# Patient Record
Sex: Female | Born: 1980 | Race: White | Hispanic: No | Marital: Single | Smoking: Current every day smoker
Health system: Southern US, Community
[De-identification: ages and names within clinical notes are randomized; demographics above are authoritative.]

## PROBLEM LIST (undated history)

## (undated) DIAGNOSIS — N289 Disorder of kidney and ureter, unspecified: Secondary | ICD-10-CM

## (undated) DIAGNOSIS — R87629 Unspecified abnormal cytological findings in specimens from vagina: Secondary | ICD-10-CM

## (undated) HISTORY — PX: OTHER SURGICAL HISTORY: SHX169

## (undated) HISTORY — DX: Unspecified abnormal cytological findings in specimens from vagina: R87.629

---

## 2017-03-16 ENCOUNTER — Encounter: Payer: Self-pay | Admitting: Emergency Medicine

## 2017-03-16 ENCOUNTER — Emergency Department: Payer: 59

## 2017-03-16 ENCOUNTER — Emergency Department
Admission: EM | Admit: 2017-03-16 | Discharge: 2017-03-16 | Disposition: A | Discharge status: Home / Self Care | Payer: 59 | Attending: Emergency Medicine | Admitting: Emergency Medicine

## 2017-03-16 ENCOUNTER — Other Ambulatory Visit: Payer: Self-pay

## 2017-03-16 DIAGNOSIS — R05 Cough: Secondary | ICD-10-CM

## 2017-03-16 DIAGNOSIS — J0100 Acute maxillary sinusitis, unspecified: Secondary | ICD-10-CM | POA: Insufficient documentation

## 2017-03-16 DIAGNOSIS — R059 Cough, unspecified: Secondary | ICD-10-CM

## 2017-03-16 DIAGNOSIS — Z87891 Personal history of nicotine dependence: Secondary | ICD-10-CM | POA: Diagnosis not present

## 2017-03-16 DIAGNOSIS — R0600 Dyspnea, unspecified: Secondary | ICD-10-CM | POA: Diagnosis present

## 2017-03-16 HISTORY — DX: Disorder of kidney and ureter, unspecified: N28.9

## 2017-03-16 MED ORDER — NAPROXEN 500 MG PO TABS: 500.0000 mg | ORAL_TABLET | Freq: Two times a day (BID) | ORAL | Status: DC

## 2017-03-16 MED ORDER — FEXOFENADINE-PSEUDOEPHED ER 60-120 MG PO TB12: 1.0000 | ORAL_TABLET | Freq: Two times a day (BID) | ORAL | 0 refills | Status: DC

## 2017-03-16 MED ORDER — KETOROLAC TROMETHAMINE 60 MG/2ML IM SOLN
60.0000 mg | Freq: Once | INTRAMUSCULAR | Status: AC
  Administered 2017-03-16: 60 mg via INTRAMUSCULAR
  Filled 2017-03-16: qty 2

## 2017-03-16 MED ORDER — BENZONATATE 100 MG PO CAPS: 200.0000 mg | ORAL_CAPSULE | Freq: Three times a day (TID) | ORAL | 0 refills | Status: AC | PRN

## 2017-03-16 MED ORDER — AMOXICILLIN 500 MG PO CAPS
500.0000 mg | ORAL_CAPSULE | Freq: Three times a day (TID) | ORAL | 0 refills | Status: DC
Start: 2017-03-16 — End: 2019-05-24

## 2017-03-16 MED ORDER — ONDANSETRON HCL 8 MG PO TABS: 8.0000 mg | ORAL_TABLET | Freq: Three times a day (TID) | ORAL | 0 refills | Status: DC | PRN

## 2017-03-16 MED ORDER — ONDANSETRON 8 MG PO TBDP
8.0000 mg | ORAL_TABLET | Freq: Once | ORAL | Status: AC
  Administered 2017-03-16: 8 mg via ORAL
  Filled 2017-03-16: qty 1

## 2017-03-16 MED ORDER — HYDROCOD POLST-CPM POLST ER 10-8 MG/5ML PO SUER
5.0000 mL | Freq: Once | ORAL | Status: AC
  Administered 2017-03-16: 5 mL via ORAL
  Filled 2017-03-16: qty 5

## 2017-03-16 NOTE — ED Triage Notes (Signed)
Arrives with c/o chest pain and SOB x 1 month.  States had been coughing for about 3 weeks, but symptoms had improved over the last couple of days.  Today arrives with sinus congestion, body aches.  States has had decreased PO intake over past 2 days.

## 2017-03-16 NOTE — ED Provider Notes (Signed)
This patient was not seen by me directly however EKG was presented to me, shows sinus tachycardia rate 105 no acute ST elevation or depression normal axis QTC 454   Jeanmarie PlantMcShane, James A, MD 03/16/17 1135

## 2017-03-16 NOTE — ED Provider Notes (Signed)
[facility] Emergency Department Provider Note   ____________________________________________   First MD Initiated Contact with Patient 03/16/17 0830     (approximate)  I have reviewed the triage vital signs and the nursing notes.   HISTORY  Chief Complaint URI and sinus congestion    HPI Karen Crane is a 36 y.o. female patient complaining of chest pain and dyspnea for 1 month. Patient also states productive cough for 3 weeks. Patient stated called wax and wane. Patient presents today that also was sinus congestion and body aches. No palliative measures for this complaint.Patient rates her pain discomfort as a 6/10. Patient described a pain as "achy".   Past Medical History:  Diagnosis Date  . Renal disorder     There are no active problems to display for this patient.   History reviewed. No pertinent surgical history.  Prior to Admission medications   Medication Sig Start Date End Date Taking? Authorizing Provider  amoxicillin (AMOXIL) 500 MG capsule Take 1 capsule (500 mg total) 3 (three) times daily by mouth. 03/16/17   Joni ReiningSmith, Ronald K, PA-C  benzonatate (TESSALON PERLES) 100 MG capsule Take 2 capsules (200 mg total) 3 (three) times daily as needed by mouth for cough. 03/16/17 03/16/18  Joni ReiningSmith, Ronald K, PA-C  fexofenadine-pseudoephedrine (ALLEGRA-D) 60-120 MG 12 hr tablet Take 1 tablet 2 (two) times daily by mouth. 03/16/17   Joni ReiningSmith, Ronald K, PA-C  naproxen (NAPROSYN) 500 MG tablet Take 1 tablet (500 mg total) 2 (two) times daily with a meal by mouth. 03/16/17   Joni ReiningSmith, Ronald K, PA-C  ondansetron (ZOFRAN) 8 MG tablet Take 1 tablet (8 mg total) every 8 (eight) hours as needed by mouth for nausea or vomiting. 03/16/17   Joni ReiningSmith, Ronald K, PA-C    Allergies Patient has no known allergies.  No family history on file.  Social History Social History   Tobacco Use  . Smoking status: Former Games developermoker  . Smokeless tobacco: Never Used  Substance  Use Topics  . Alcohol use: Yes  . Drug use: Yes    Types: Cocaine, Marijuana    Review of Systems  Constitutional: Fever chills Eyes: No visual changes. ENT: Nasal congestion runny nose  Cardiovascular: Denies chest pain. Respiratory: Dyspnea and productive cough Gastrointestinal: No abdominal pain.  No nausea, no vomiting.  No diarrhea.  No constipation. Genitourinary: Negative for dysuria. Musculoskeletal: Negative for back pain. Skin: Negative for rash. Neurological: Positive for headaches, focal weakness or numbness.   ____________________________________________   PHYSICAL EXAM:  VITAL SIGNS: ED Triage Vitals  Enc Vitals Group     BP 03/16/17 0812 (!) 134/97     Pulse Rate 03/16/17 0812 (!) 105     Resp 03/16/17 0812 (!) 22     Temp 03/16/17 0812 (!) 97.4 F (36.3 C)     Temp Source 03/16/17 0812 Oral     SpO2 03/16/17 0812 100 %     Weight 03/16/17 0813 140 lb (63.5 kg)     Height 03/16/17 0813 5\' 5"  (1.651 m)     Head Circumference --      Peak Flow --      Pain Score 03/16/17 0812 6     Pain Loc --      Pain Edu? --      Excl. in GC? --     Constitutional: Alert and oriented. Appears malaise  Eyes: Conjunctivae are normal. PERRL. EOMI. Head: Atraumatic. Nose: Edematous nasal turbinates clear rhinorrhea. Bilateral frontal and maxillary  guarding Mouth/Throat: Mucous membranes are moist.  Oropharynx non-erythematous. Postnasal drainage Neck: No stridor.  No cervical spine tenderness to palpation. Hematological/Lymphatic/Immunilogical: No cervical lymphadenopathy. Cardiovascular: Normal rate, regular rhythm. Grossly normal heart sounds.  Good peripheral circulation. Respiratory: Normal respiratory effort.  No retractions. Lungs CTAB. Nonproductive cough Gastrointestinal: Soft and nontender. No distention. No abdominal bruits. No CVA tenderness. Neurologic:  Normal speech and language. No gross focal neurologic deficits are appreciated. No gait  instability. Skin:  Skin is warm, dry and intact. No rash noted. Psychiatric: Mood and affect are normal. Speech and behavior are normal.  ____________________________________________   LABS (all labs ordered are listed, but only abnormal results are displayed)  Labs Reviewed - No data to display ____________________________________________  EKG   ____________________________________________  RADIOLOGY  Dg Chest 2 View  Result Date: 03/16/2017 CLINICAL DATA:  Chest pain and shortness of breath for 1 month. EXAM: CHEST  2 VIEW COMPARISON:  01/09/2015. FINDINGS: The cardiac silhouette, mediastinal and hilar contours are normal. The lungs are clear. No pleural effusion. The bony thorax is intact. IMPRESSION: No acute cardiopulmonary findings. Electronically Signed   By: Rudie MeyerP.  Gallerani M.D.   On: 03/16/2017 09:13    ____________________________________________   PROCEDURES  Procedure(s) performed: None  Procedures  Critical Care performed: No  ____________________________________________   INITIAL IMPRESSION / ASSESSMENT AND PLAN / ED COURSE  As part of my medical decision making, I reviewed the following data within the electronic MEDICAL RECORD NUMBER  Sinusitis  Patient complaining of chest pain or shortness of breath for 1 month. Patient state cough about 3 weeks. Patient state complaining wax and wane. Today patient states sinus congestion and body aches has increased. Patient also states decreased by mouth intake secondary to nausea. Patient denies vomiting or diarrhea. Discussed x-ray finding with patient. Advised take medication as directed and follow-up with PCP if no improvement.      ____________________________________________   FINAL CLINICAL IMPRESSION(S) / ED DIAGNOSES  Final diagnoses:  Subacute maxillary sinusitis  Cough     ED Discharge Orders        Ordered    amoxicillin (AMOXIL) 500 MG capsule  3 times daily     03/16/17 0956    ondansetron  (ZOFRAN) 8 MG tablet  Every 8 hours PRN     03/16/17 0956    fexofenadine-pseudoephedrine (ALLEGRA-D) 60-120 MG 12 hr tablet  2 times daily     03/16/17 0956    benzonatate (TESSALON PERLES) 100 MG capsule  3 times daily PRN     03/16/17 0956    naproxen (NAPROSYN) 500 MG tablet  2 times daily with meals     03/16/17 0956       Note:  This document was prepared using Dragon voice recognition software and may include unintentional dictation errors.    Joni ReiningSmith, Ronald K, PA-C 03/16/17 1002    Don PerkingVeronese, [place], MD 03/21/17 [phone]

## 2017-03-16 NOTE — ED Notes (Signed)
Patient transported to X-ray 

## 2017-03-16 NOTE — ED Notes (Signed)
Pt made inappropriate comment about provider seeing her stating that he was a "dickhead" and that she needs a CT scan of her abdomen and states there is something wrong and its not her sinuses.  Pt states "[facility]  I told patient that I do not want to hear her make comments about the provider and that if she has any problems with her visit today to speak with someone from patient experience this afternoon.  [facility]

## 2017-03-16 NOTE — ED Notes (Signed)
Pt c/o sinus congestion and cough for several weeks.  She is c/o generalized body aches today and some nausea.

## 2017-08-07 ENCOUNTER — Other Ambulatory Visit: Payer: Self-pay | Admitting: Physician Assistant

## 2019-05-24 ENCOUNTER — Encounter: Payer: Self-pay | Admitting: Emergency Medicine

## 2019-05-24 ENCOUNTER — Ambulatory Visit
Admission: EM | Admit: 2019-05-24 | Discharge: 2019-05-24 | Disposition: A | Discharge status: Home / Self Care | Payer: 59 | Attending: Family Medicine | Admitting: Family Medicine

## 2019-05-24 ENCOUNTER — Ambulatory Visit (INDEPENDENT_AMBULATORY_CARE_PROVIDER_SITE_OTHER): Payer: 59

## 2019-05-24 ENCOUNTER — Other Ambulatory Visit: Payer: Self-pay

## 2019-05-24 DIAGNOSIS — M79671 Pain in right foot: Secondary | ICD-10-CM

## 2019-05-24 DIAGNOSIS — S93401A Sprain of unspecified ligament of right ankle, initial encounter: Secondary | ICD-10-CM | POA: Diagnosis not present

## 2019-05-24 DIAGNOSIS — M25571 Pain in right ankle and joints of right foot: Secondary | ICD-10-CM

## 2019-05-24 DIAGNOSIS — W109XXA Fall (on) (from) unspecified stairs and steps, initial encounter: Secondary | ICD-10-CM | POA: Diagnosis not present

## 2019-05-24 MED ORDER — KETOROLAC TROMETHAMINE 10 MG PO TABS: 10.0000 mg | ORAL_TABLET | Freq: Four times a day (QID) | ORAL | 0 refills | Status: DC | PRN

## 2019-05-24 NOTE — Discharge Instructions (Signed)
Rest, ice, elevation.  Medication as needed for pain.  Take care  Dr. Ruvi Fullenwider  

## 2019-05-24 NOTE — ED Provider Notes (Signed)
[facility]    CSN: [id number] Arrival date & time: 05/24/19  1109      History   Chief Complaint Chief Complaint  Patient presents with  . Foot Pain    right  . Ankle Pain    right   HPI  39 year old female presents with the above complaints.  Patient states that she fell down several stairs last night.  She states that she is unsure of why or how she fell.  She states that she was drinking alcohol at the time.  Patient states that it was at least 5 steps.  Patient states that after she fell she injured her right foot and ankle.  Denies head injury.  Denies loss of consciousness.  Patient states that her pain is 10/10 in severity.  She reports associated numbness.  No relieving factors.  No visible swelling.  No other associated symptoms.  No other complaints at this time.  Home Medications    Prior to Admission medications   Medication Sig Start Date End Date Taking? Authorizing Provider  mirtazapine (REMERON) 30 MG tablet Take 30 mg by mouth at bedtime. 05/19/19  Yes [provider]  fexofenadine-pseudoephedrine (ALLEGRA-D) 60-120 MG 12 hr tablet Take 1 tablet 2 (two) times daily by mouth. 03/16/17   Joni Reining, PA-C  ketorolac (TORADOL) 10 MG tablet Take 1 tablet (10 mg total) by mouth every 6 (six) hours as needed for moderate pain or severe pain. 05/24/19   Tommie Sams, DO  ondansetron (ZOFRAN) 8 MG tablet Take 1 tablet (8 mg total) every 8 (eight) hours as needed by mouth for nausea or vomiting. 03/16/17   Joni Reining, PA-C  pantoprazole (PROTONIX) 40 MG tablet  05/19/19   [provider]    Family History Family History  Problem Relation Age of Onset  . Healthy Mother     Social History Social History   Tobacco Use  . Smoking status: Former Games developer  . Smokeless tobacco: Never Used  Substance Use Topics  . Alcohol use: Yes  . Drug use: Yes    Types: Cocaine, Marijuana     Allergies   Vicodin  [hydrocodone-acetaminophen]   Review of Systems Review of Systems  Constitutional: Negative.   Musculoskeletal:       Foot and ankle pain.   Physical Exam Triage Vital Signs ED Triage Vitals  Enc Vitals Group     BP 05/24/19 1128 120/80     Pulse Rate 05/24/19 1128 96     Resp 05/24/19 1128 16     Temp 05/24/19 1128 98.2 F (36.8 C)     Temp Source 05/24/19 1128 Oral     SpO2 05/24/19 1128 100 %     Weight 05/24/19 1124 145 lb (65.8 kg)     Height 05/24/19 1124 5\' 4"  (1.626 m)     Head Circumference --      Peak Flow --      Pain Score 05/24/19 1123 10     Pain Loc --      Pain Edu? --      Excl. in GC? --    Updated Vital Signs BP 120/80 (BP Location: Left Arm)   Pulse 96   Temp 98.2 F (36.8 C) (Oral)   Resp 16   Ht 5\' 4"  (1.626 m)   Wt 65.8 kg   LMP 05/17/2019 (Approximate)   SpO2 100%   BMI 24.89 kg/m   Visual Acuity Right Eye Distance:   Left  Eye Distance:   Bilateral Distance:    Right Eye Near:   Left Eye Near:    Bilateral Near:     Physical Exam Vitals and nursing note reviewed.  Constitutional:      General: She is not in acute distress.    Appearance: Normal appearance. She is not ill-appearing.  HENT:     Head: Normocephalic and atraumatic.  Eyes:     General:        Right eye: No discharge.        Left eye: No discharge.     Conjunctiva/sclera: Conjunctivae normal.  Cardiovascular:     Rate and Rhythm: Normal rate and regular rhythm.  Pulmonary:     Effort: Pulmonary effort is normal.     Breath sounds: Normal breath sounds. No wheezing, rhonchi or rales.  Musculoskeletal:     Comments: Right foot and ankle -patient with tenderness around the medial malleolus.  Patient also with tenderness on the dorsum of the foot.  Out of proportion to force applied.  2+ dorsalis pedis pulse.  Neurological:     Mental Status: She is alert.  Psychiatric:        Mood and Affect: Mood normal.        Behavior: Behavior normal.    UC Treatments /  Results  Labs (all labs ordered are listed, but only abnormal results are displayed) Labs Reviewed - No data to display  EKG   Radiology DG Ankle Complete Right  Result Date: 05/24/2019 CLINICAL DATA:  Fall yesterday, pain. EXAM: RIGHT ANKLE - COMPLETE 3+ VIEW COMPARISON:  None. FINDINGS: Osseous alignment is normal. Ankle mortise is symmetric. No fracture line or displaced fracture fragment seen. Visualized portions of the hindfoot and midfoot appear intact and normally aligned. Adjacent soft tissues are unremarkable. IMPRESSION: Negative. Electronically Signed   By: Franki Cabot M.D.   On: 05/24/2019 11:56   DG Foot Complete Right  Result Date: 05/24/2019 CLINICAL DATA:  Fall yesterday, foot pain. EXAM: RIGHT FOOT COMPLETE - 3+ VIEW COMPARISON:  None. FINDINGS: Osseous alignment is normal. No fracture line or displaced fracture fragment identified. No degenerative change. Soft tissues about the RIGHT foot are unremarkable. IMPRESSION: Negative. Electronically Signed   By: Franki Cabot M.D.   On: 05/24/2019 11:57    Procedures Procedures (including critical care time)  Medications Ordered in UC Medications - No data to display  Initial Impression / Assessment and Plan / UC Course  I have reviewed the triage vital signs and the nursing notes.  Pertinent labs & imaging results that were available during my care of the patient were reviewed by me and considered in my medical decision making (see chart for details).    39 year old female presents with an ankle sprain.  Acute uncomplicated injury.  X-ray negative.  Advised rest, ice, elevation.  Toradol for pain.  Supportive care.  Final Clinical Impressions(s) / UC Diagnoses   Final diagnoses:  Sprain of right ankle, unspecified ligament, initial encounter     Discharge Instructions     Rest, ice, elevation.  Medication as needed for pain.  Take care  Dr. Lacinda Axon    ED Prescriptions    Medication Sig Dispense Auth.  Provider   ketorolac (TORADOL) 10 MG tablet Take 1 tablet (10 mg total) by mouth every 6 (six) hours as needed for moderate pain or severe pain. 20 tablet Coral Spikes, DO     PDMP not reviewed this encounter.   Coral Spikes, DO  05/24/19 1251  

## 2019-05-24 NOTE — ED Triage Notes (Signed)
Patient states that she fell down that stairs last night.  Patient c/o pain in her right ankle and foot.  Patient has not taken anything for her pain today.  Patient is unsure how many steps she fell down.  Patient denies hitting her head.  Patient denies LOC.

## 2019-12-22 ENCOUNTER — Ambulatory Visit: Payer: Self-pay

## 2019-12-22 ENCOUNTER — Encounter: Payer: Self-pay | Admitting: Physician Assistant

## 2019-12-22 ENCOUNTER — Other Ambulatory Visit: Payer: Self-pay

## 2019-12-22 ENCOUNTER — Ambulatory Visit: Payer: Self-pay | Admitting: Physician Assistant

## 2019-12-22 DIAGNOSIS — Z202 Contact with and (suspected) exposure to infections with a predominantly sexual mode of transmission: Secondary | ICD-10-CM

## 2019-12-22 DIAGNOSIS — Z113 Encounter for screening for infections with a predominantly sexual mode of transmission: Secondary | ICD-10-CM

## 2019-12-22 LAB — WET PREP FOR TRICH, YEAST, CLUE
Trichomonas Exam: NEGATIVE
Yeast Exam: NEGATIVE

## 2019-12-22 LAB — PREGNANCY, URINE: Preg Test, Ur: NEGATIVE

## 2019-12-22 MED ORDER — AZITHROMYCIN 500 MG PO TABS
1000.0000 mg | ORAL_TABLET | Freq: Once | ORAL | Status: AC
  Administered 2019-12-22: 17:00:00 1000 mg via ORAL

## 2019-12-22 NOTE — Progress Notes (Signed)
  [facility] Department STI clinic/screening visit  Subjective:  Karen Crane is a 39 y.o. female being seen today for an STI screening visit. The patient reports they do have symptoms.  Patient reports that they do not desire a pregnancy in the next year.   They reported they are not interested in discussing contraception today.  Patient's last menstrual period was 11/03/2019.   Patient has the following medical conditions:  There are no problems to display for this patient.   Chief Complaint  Patient presents with  . SEXUALLY TRANSMITTED DISEASE    screening    HPI  Patient reports that she has been having white discharge and dysuria for 1 week.  Reports that she is also a contact to Chlamydia.  Denies other symtpoms.  Reports that she last had a HIV test less than 1 month ago and last pap was 2 years ago.   See flowsheet for further details and programmatic requirements.    The following portions of the patient's history were reviewed and updated as appropriate: allergies, current medications, past medical history, past social history, past surgical history and problem list.  Objective:  There were no vitals filed for this visit.  Physical Exam Constitutional:      General: She is not in acute distress.    Appearance: Normal appearance.  HENT:     Head: Normocephalic and atraumatic.  Eyes:     Conjunctiva/sclera: Conjunctivae normal.  Pulmonary:     Effort: Pulmonary effort is normal.  Neurological:     Mental Status: She is alert and oriented to person, place, and time.  Psychiatric:        Mood and Affect: Mood normal.        Behavior: Behavior normal.        Thought Content: Thought content normal.        Judgment: Judgment normal.   patient opts to self collect samples for GC/Chlamydia and wet mount testing today.    Assessment and Plan:  Karen Crane is a 39 y.o. female presenting to the [facility] Department for STI  screening  1. Screening for STD (sexually transmitted disease) Patient into clinic with symptoms. Counseled patient how to collect samples for vaginal testing. Patient declines blood work today.  Rec condoms with all sex. Await test results.  Counseled that RN will call if needs to RTC for treatment once results are back. - WET PREP FOR TRICH, YEAST, CLUE - Chlamydia/Gonorrhea [facility] - Pregnancy, urine  2. Chlamydia contact Treat as a contact to Chlamydia with Azithromycin 1 g po DOT today. No sex for 7 days and until after partner completes treatment.  RTC for re-treatment if vomits < 2 hr after taking medicine. - azithromycin (ZITHROMAX) tablet 1,000 mg     No follow-ups on file.  No future appointments.  Matt Holmes, PA

## 2019-12-22 NOTE — Progress Notes (Signed)
Wet mount and UPT reviewed. Pt treated with Azithromycin per provider orders. Provider orders completed.

## 2019-12-24 ENCOUNTER — Ambulatory Visit: Payer: Self-pay

## 2019-12-24 ENCOUNTER — Other Ambulatory Visit: Payer: Self-pay

## 2019-12-24 DIAGNOSIS — Z202 Contact with and (suspected) exposure to infections with a predominantly sexual mode of transmission: Secondary | ICD-10-CM

## 2019-12-24 MED ORDER — AZITHROMYCIN 500 MG PO TABS
1000.0000 mg | ORAL_TABLET | Freq: Once | ORAL | Status: AC
  Administered 2019-12-24: 16:00:00 1000 mg via ORAL

## 2019-12-24 NOTE — Progress Notes (Signed)
Per client, vomited within approximately 30 minutes after taking Azithromycin on Monday this week. States thinks she got too hot as had had a meal prior to appt. States had a taco and chips prior to appt today. Jossie Ng, RN

## 2020-06-08 ENCOUNTER — Ambulatory Visit: Payer: Self-pay

## 2020-06-10 ENCOUNTER — Ambulatory Visit: Payer: Self-pay

## 2020-06-18 ENCOUNTER — Emergency Department: Payer: No Typology Code available for payment source

## 2020-06-18 ENCOUNTER — Other Ambulatory Visit: Payer: Self-pay

## 2020-06-18 ENCOUNTER — Emergency Department
Admission: EM | Admit: 2020-06-18 | Discharge: 2020-06-18 | Disposition: A | Discharge status: Home / Self Care | Payer: No Typology Code available for payment source | Attending: Emergency Medicine | Admitting: Emergency Medicine

## 2020-06-18 DIAGNOSIS — S80812A Abrasion, left lower leg, initial encounter: Secondary | ICD-10-CM | POA: Insufficient documentation

## 2020-06-18 DIAGNOSIS — F10929 Alcohol use, unspecified with intoxication, unspecified: Secondary | ICD-10-CM

## 2020-06-18 DIAGNOSIS — T1491XA Suicide attempt, initial encounter: Secondary | ICD-10-CM | POA: Diagnosis not present

## 2020-06-18 DIAGNOSIS — T1490XA Injury, unspecified, initial encounter: Secondary | ICD-10-CM

## 2020-06-18 DIAGNOSIS — Y92410 Unspecified street and highway as the place of occurrence of the external cause: Secondary | ICD-10-CM | POA: Insufficient documentation

## 2020-06-18 DIAGNOSIS — Y906 Blood alcohol level of 120-199 mg/100 ml: Secondary | ICD-10-CM | POA: Insufficient documentation

## 2020-06-18 DIAGNOSIS — Z87891 Personal history of nicotine dependence: Secondary | ICD-10-CM | POA: Diagnosis not present

## 2020-06-18 DIAGNOSIS — Z20822 Contact with and (suspected) exposure to covid-19: Secondary | ICD-10-CM | POA: Diagnosis not present

## 2020-06-18 DIAGNOSIS — F1994 Other psychoactive substance use, unspecified with psychoactive substance-induced mood disorder: Secondary | ICD-10-CM

## 2020-06-18 DIAGNOSIS — F1010 Alcohol abuse, uncomplicated: Secondary | ICD-10-CM

## 2020-06-18 DIAGNOSIS — S80811A Abrasion, right lower leg, initial encounter: Secondary | ICD-10-CM | POA: Insufficient documentation

## 2020-06-18 LAB — COMPREHENSIVE METABOLIC PANEL
ALT: 18 U/L (ref 0–44)
AST: 24 U/L (ref 15–41)
Albumin: 4.4 g/dL (ref 3.5–5.0)
Alkaline Phosphatase: 37 U/L — ABNORMAL LOW (ref 38–126)
Anion gap: 11 (ref 5–15)
BUN: 22 mg/dL — ABNORMAL HIGH (ref 6–20)
CO2: 20 mmol/L — ABNORMAL LOW (ref 22–32)
Calcium: 9.2 mg/dL (ref 8.9–10.3)
Chloride: 106 mmol/L (ref 98–111)
Creatinine, Ser: 1.46 mg/dL — ABNORMAL HIGH (ref 0.44–1.00)
GFR, Estimated: 46 mL/min — ABNORMAL LOW (ref >60)
Glucose, Bld: 115 mg/dL — ABNORMAL HIGH (ref 70–99)
Potassium: 3.7 mmol/L (ref 3.5–5.1)
Sodium: 137 mmol/L (ref 135–145)
Total Bilirubin: 0.9 mg/dL (ref 0.3–1.2)
Total Protein: 7.2 g/dL (ref 6.5–8.1)

## 2020-06-18 LAB — CBC
HCT: 36.1 % (ref 36.0–46.0)
Hemoglobin: 12.6 g/dL (ref 12.0–15.0)
MCH: 33.3 pg (ref 26.0–34.0)
MCHC: 34.9 g/dL (ref 30.0–36.0)
MCV: 95.5 fL (ref 80.0–100.0)
Platelets: 314 10*3/uL (ref 150–400)
RBC: 3.78 MIL/uL — ABNORMAL LOW (ref 3.87–5.11)
RDW: 12.3 % (ref 11.5–15.5)
WBC: 7 10*3/uL (ref 4.0–10.5)
nRBC: 0 % (ref 0.0–0.2)

## 2020-06-18 LAB — URINE DRUG SCREEN, QUALITATIVE (ARMC ONLY)
Amphetamines, Ur Screen: NOT DETECTED
Barbiturates, Ur Screen: NOT DETECTED
Benzodiazepine, Ur Scrn: NOT DETECTED
Cannabinoid 50 Ng, Ur ~~LOC~~: POSITIVE — AB
Cocaine Metabolite,Ur ~~LOC~~: POSITIVE — AB
MDMA (Ecstasy)Ur Screen: NOT DETECTED
Methadone Scn, Ur: NOT DETECTED
Opiate, Ur Screen: NOT DETECTED
Phencyclidine (PCP) Ur S: NOT DETECTED
Tricyclic, Ur Screen: NOT DETECTED

## 2020-06-18 LAB — SALICYLATE LEVEL: Salicylate Lvl: <7 mg/dL — ABNORMAL LOW (ref 7.0–30.0)

## 2020-06-18 LAB — RESP PANEL BY RT-PCR (FLU A&B, COVID) ARPGX2
Influenza A by PCR: NEGATIVE
Influenza B by PCR: NEGATIVE
SARS Coronavirus 2 by RT PCR: NEGATIVE

## 2020-06-18 LAB — ETHANOL: Alcohol, Ethyl (B): 193 mg/dL — ABNORMAL HIGH (ref <10)

## 2020-06-18 LAB — ACETAMINOPHEN LEVEL: Acetaminophen (Tylenol), Serum: <10 ug/mL — ABNORMAL LOW (ref 10–30)

## 2020-06-18 LAB — HCG, QUANTITATIVE, PREGNANCY: hCG, Beta Chain, Quant, S: <1 m[IU]/mL (ref <5)

## 2020-06-18 LAB — PREGNANCY, URINE: Preg Test, Ur: NEGATIVE

## 2020-06-18 MED ORDER — IOHEXOL 300 MG/ML  SOLN
100.0000 mL | Freq: Once | INTRAMUSCULAR | Status: AC | PRN
  Administered 2020-06-18: 100 mL via INTRAVENOUS

## 2020-06-18 MED ORDER — ACETAMINOPHEN 500 MG PO TABS
1000.0000 mg | ORAL_TABLET | Freq: Once | ORAL | Status: AC
  Administered 2020-06-18: 1000 mg via ORAL

## 2020-06-18 MED ORDER — LACTATED RINGERS IV BOLUS
1000.0000 mL | Freq: Once | INTRAVENOUS | Status: AC
  Administered 2020-06-18: 1000 mL via INTRAVENOUS

## 2020-06-18 MED ORDER — ONDANSETRON HCL 4 MG/2ML IJ SOLN
4.0000 mg | Freq: Once | INTRAMUSCULAR | Status: AC
  Administered 2020-06-18: 4 mg via INTRAVENOUS
  Filled 2020-06-18: qty 2

## 2020-06-18 MED ORDER — FENTANYL CITRATE (PF) 100 MCG/2ML IJ SOLN
50.0000 ug | Freq: Once | INTRAMUSCULAR | Status: AC
Start: 2020-06-18 — End: 2020-06-18
  Administered 2020-06-18: 50 ug via INTRAVENOUS
  Filled 2020-06-18: qty 2

## 2020-06-18 MED ORDER — ACETAMINOPHEN 500 MG PO TABS
ORAL_TABLET | ORAL | Status: AC
  Filled 2020-06-18: qty 2

## 2020-06-18 NOTE — ED Notes (Signed)
Fluid finished. Pump turned off. Pt still remains asleep at this time.

## 2020-06-18 NOTE — ED Notes (Signed)
Pt crying and requesting cervical collar to be removed. Explained to pt need for cervical collar to prevent injury. Pt continues to cry and ask for collar to be removed.

## 2020-06-18 NOTE — ED Notes (Signed)
IVC/ Not medically clear yet

## 2020-06-18 NOTE — ED Notes (Signed)
Pt removed cervical collar and threw it onto floor. Pt informed of risks of removing cervical collar.

## 2020-06-18 NOTE — ED Notes (Signed)
Pt now awake, c/o need to urinate, also c/o chest burning and bilateral leg pain. Repeat EKG obtained by this RN and given to EDP. Pt assisted to the bathroom by Vernona Rieger, EDT who continues to sit 1:1 with patient due to suicide attempt. Pt noted to be tearful and crying stating, "I want to go home, I want to go home".
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
## 2020-06-18 NOTE — ED Notes (Signed)
Patient had a visitor.

## 2020-06-18 NOTE — ED Notes (Signed)
EDP made aware of patient's BP 86/46 after completion of LR bolus. VORB for 1L LR at this time initiated by this RN. Will re-assess BP after completion of 2nd liter LR.
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
## 2020-06-18 NOTE — ED Notes (Signed)
Pt c/o continued pain to her chest, also c/o headache, pt requesting personal belongings and to go home, this RN explained unable to give patient her personal belongings until cleared by psychiatrist. Pt noted to become anxious and tearful when plan of care discussed with her. Explained patient could not go home until psychiatrist says she can. Pt continues to have 1:1 sitter with patient at this time .
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
## 2020-06-18 NOTE — ED Notes (Signed)
Chest xray at bedside.

## 2020-06-18 NOTE — BH Assessment (Signed)
Writer unable to complete consult at this time. Patient unable to participate in the interview. 

## 2020-06-18 NOTE — ED Provider Notes (Signed)
ED ECG REPORT I, Chesley Noon, the attending physician, personally viewed and interpreted this ECG.   Date: 06/18/2020  EKG Time: 10:23  Rate: 99  Rhythm: normal sinus rhythm  Axis: Normal  Intervals:none  ST&T Change: None    Chesley Noon, MD 06/18/20 1032

## 2020-06-18 NOTE — ED Notes (Signed)
Beth 1-1 sitter with pt.

## 2020-06-18 NOTE — ED Notes (Signed)
Pt has been very eager to talk with doctor or psych about being released from hospital. Pt has had a visitor (God daughter) Pt has spoke with her sister who is bringing her cell Advertising account planner. Tech has agreed to charge pt cell phone and allow pt to obtain a few numbers from the cell phone so that she can make arrangements for her dogs to be taken care of. Pt has already notified her work place ([employer] in [place]) earlier this morning to let them know where she was. PT sister did Radiographer, therapeutic and tech is handling that. Pt says, "I really do not want to go downstairs,. I have to much to do to be in the hospital"

## 2020-06-18 NOTE — ED Notes (Signed)
TTS at bedside at this time.  

## 2020-06-18 NOTE — ED Notes (Signed)
EDP at bedside to medically clear patient to move to [facility].

## 2020-06-18 NOTE — ED Notes (Signed)
Pt continues to be tearful stating "I just want to go back to sleep, just let me sleep, it hurts!". Medication administered per order. Pt continues to have 1:1 sitter, given something to drink by Vernona Rieger, EDT.
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
## 2020-06-18 NOTE — ED Notes (Signed)
Pt complaining of burning in the chest. MD made aware. EKG performed per MD verbal order.

## 2020-06-18 NOTE — ED Notes (Signed)
Pt stood to go to the bathroom. She complained of a lot of pain in her legs. Her gait was steady but not strong.   lw edt

## 2020-06-18 NOTE — ED Notes (Signed)
Papers rescinded pend dispo

## 2020-06-18 NOTE — ED Notes (Signed)
Pt states she needs to pee. External urinary catheter applied and connected to suction.
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
## 2020-06-18 NOTE — ED Notes (Signed)
Plan of care to discharge to home. Awaiting discharge papers. Patient sitting in day room.
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
## 2020-09-07 ENCOUNTER — Other Ambulatory Visit: Payer: Self-pay

## 2020-09-07 ENCOUNTER — Encounter: Payer: Self-pay | Admitting: Family Medicine

## 2020-09-07 ENCOUNTER — Ambulatory Visit: Payer: Self-pay | Admitting: Family Medicine

## 2020-09-07 DIAGNOSIS — Z202 Contact with and (suspected) exposure to infections with a predominantly sexual mode of transmission: Secondary | ICD-10-CM

## 2020-09-07 DIAGNOSIS — Z113 Encounter for screening for infections with a predominantly sexual mode of transmission: Secondary | ICD-10-CM

## 2020-09-07 LAB — WET PREP FOR TRICH, YEAST, CLUE
Trichomonas Exam: NEGATIVE
Yeast Exam: NEGATIVE

## 2020-09-07 LAB — HM HIV SCREENING LAB: HM HIV Screening: NEGATIVE

## 2020-09-07 LAB — HM HEPATITIS C SCREENING LAB: HM Hepatitis Screen: NEGATIVE

## 2020-09-07 MED ORDER — CEFTRIAXONE SODIUM 500 MG IJ SOLR
500.0000 mg | Freq: Once | INTRAMUSCULAR | Status: AC
Start: 2020-09-07 — End: 2020-09-07
  Administered 2020-09-07: 500 mg via INTRAMUSCULAR

## 2020-09-07 MED ORDER — DOXYCYCLINE HYCLATE 100 MG PO TABS: 100.0000 mg | ORAL_TABLET | Freq: Two times a day (BID) | ORAL | 0 refills | Status: AC

## 2020-09-07 NOTE — Progress Notes (Signed)
Pt here for STD screening.   Wet mount results reviewed with Provider, no treatment required. Pt scheduled for PE on 5/31 @ 4pm. Ceftriaxone 500 mg IM given in RUOQ without any complications. Berdie Ogren, RN

## 2020-09-07 NOTE — Progress Notes (Signed)
[facility] Department STI clinic/screening visit  Subjective:  Karen Crane is a 40 y.o. female being seen today for an STI screening visit. The patient reports they do have symptoms.  Patient reports that they do desire a pregnancy in the next year.   They reported they are interested in discussing contraception today.  Patient's last menstrual period was 07/31/2020.   Patient has the following medical conditions:   Patient Active Problem List   Diagnosis Date Noted  . Substance induced mood disorder (HCC) 06/18/2020  . Alcohol abuse 06/18/2020    Chief Complaint  Patient presents with  . SEXUALLY TRANSMITTED DISEASE    screening    HPI  Patient reports they are a contact to gonorrhea and chlamydia and has symptoms.    Last HIV test per patient/review of record was a few years ago  Patient reports last pap was a couple of years ago   See flowsheet for further details and programmatic requirements.    The following portions of the patient's history were reviewed and updated as appropriate: allergies, current medications, past medical history, past social history, past surgical history and problem list.  Objective:  There were no vitals filed for this visit.  Physical Exam Vitals and nursing note reviewed.  Constitutional:      Appearance: Normal appearance.  HENT:     Head: Normocephalic and atraumatic.     Mouth/Throat:     Mouth: Mucous membranes are moist.     Pharynx: Oropharynx is clear. No oropharyngeal exudate or posterior oropharyngeal erythema.  Pulmonary:     Effort: Pulmonary effort is normal.  Chest:  Breasts:     Right: No axillary adenopathy or supraclavicular adenopathy.     Left: No axillary adenopathy or supraclavicular adenopathy.    Abdominal:     General: Abdomen is flat.     Palpations: There is no mass.     Tenderness: There is no abdominal tenderness. There is no rebound.  Genitourinary:    General: Normal vulva.      Exam position: Lithotomy position.     Pubic Area: No rash or pubic lice.      Labia:        Right: No rash or lesion.        Left: No rash or lesion.      Vagina: Normal. No vaginal discharge, erythema, bleeding or lesions.     Cervix: No cervical motion tenderness, discharge, friability, lesion or erythema.     Uterus: Normal.      Adnexa: Right adnexa normal and left adnexa normal.     Rectum: Normal.     Comments: External genitalia without, lice, nits, erythema, edema , lesions or inguinal adenopathy. Vagina with normal mucosa and watery grey discharge and pH equals 4.  Cervix without visual lesions, uterus firm, mobile, non-tender, no masses, CMT adnexal fullness or tenderness.  Lymphadenopathy:     Head:     Right side of head: No preauricular or posterior auricular adenopathy.     Left side of head: No preauricular or posterior auricular adenopathy.     Cervical: No cervical adenopathy.     Upper Body:     Right upper body: No supraclavicular or axillary adenopathy.     Left upper body: No supraclavicular or axillary adenopathy.     Lower Body: No right inguinal adenopathy. No left inguinal adenopathy.  Skin:    General: Skin is warm and dry.     Findings: No rash.  Neurological:     Mental Status: She is alert and oriented to person, place, and time.      Assessment and Plan:  Karen Crane is a 40 y.o. female presenting to the [facility] Department for STI screening  1. Screening examination for venereal disease  - Chlamydia/Gonorrhea [place] [facility] - HBV Antigen/Antibody [facility] - HIV/HCV [place] [facility] - Syphilis Serology, [place] [facility] - WET PREP FOR TRICH, YEAST, CLUE - Chlamydia/Gonorrhea  Lab   Patient accepted all screenings including wet pre, oral, vaginal CT/GC and bloodwork for HIV/RPR.  Patient meets criteria for HepB screening? Yes. Ordered? Yes Patient meets criteria for HepC screening? Yes. Ordered? Yes  2. Exposure to  gonorrhea  - cefTRIAXone (ROCEPHIN) injection 500 mg  3. Exposure to chlamydia  - doxycycline (VIBRA-TABS) 100 MG tablet; Take 1 tablet (100 mg total) by mouth 2 (two) times daily for 7 days.  Dispense: 14 tablet; Refill: 0  Wet prep results neg Treatment needed as contact for gonorrhea and chlamydia.  Discussed time line for State Lab results and that patient will be called with positive results and encouraged patient to call if she had not heard in 2 weeks.  Counseled to return or seek care for continued or worsening symptoms Recommended condom use with all sex  Patient is currently using no BCM   to prevent pregnancy.   Please schedule for well woman Physical and Depo      No follow-ups on file.  Future Appointments  Date Time Provider Department Center  09/28/2020  4:00 PM [facility] PROVIDER [facility] None    Wendi Snipes, FNP

## 2020-09-23 ENCOUNTER — Telehealth: Payer: Self-pay | Admitting: Family Medicine

## 2020-09-23 NOTE — Telephone Encounter (Signed)
Patient wants to know the results of the STI tests. Please call her back.

## 2020-09-23 NOTE — Telephone Encounter (Signed)
Call to client and verified DOB, last 4 numbers of SSN and password provided at 09/07/2020 appt. Counseled that all testing done at appt was negative and that Hepatitis B results were still pending. Per client, has physical appt 09/28/20 and will get Hepatitis B results at that appt. Client states having vaginal discharge since Doxycycline started (contact to chlyamdia per client). Counseled that could be a yeast infection. Client states since just a few days until next appt will wait to have checked out at that time. Jossie Ng, RN

## 2020-09-28 ENCOUNTER — Encounter: Payer: Self-pay | Admitting: Family Medicine

## 2020-09-28 ENCOUNTER — Ambulatory Visit (LOCAL_COMMUNITY_HEALTH_CENTER): Payer: Self-pay | Admitting: Physician Assistant

## 2020-09-28 ENCOUNTER — Other Ambulatory Visit: Payer: Self-pay

## 2020-09-28 VITALS — BP 108/71 | Ht 65.25 in | Wt 133.0 lb

## 2020-09-28 DIAGNOSIS — Z30013 Encounter for initial prescription of injectable contraceptive: Secondary | ICD-10-CM

## 2020-09-28 DIAGNOSIS — B9689 Other specified bacterial agents as the cause of diseases classified elsewhere: Secondary | ICD-10-CM

## 2020-09-28 DIAGNOSIS — N760 Acute vaginitis: Secondary | ICD-10-CM

## 2020-09-28 DIAGNOSIS — Z113 Encounter for screening for infections with a predominantly sexual mode of transmission: Secondary | ICD-10-CM

## 2020-09-28 DIAGNOSIS — Z3009 Encounter for other general counseling and advice on contraception: Secondary | ICD-10-CM

## 2020-09-28 LAB — WET PREP FOR TRICH, YEAST, CLUE
Trichomonas Exam: NEGATIVE
Yeast Exam: NEGATIVE

## 2020-09-28 MED ORDER — METRONIDAZOLE 500 MG PO TABS: 500.0000 mg | ORAL_TABLET | Freq: Two times a day (BID) | ORAL | 0 refills | Status: AC

## 2020-09-28 MED ORDER — MEDROXYPROGESTERONE ACETATE 150 MG/ML IM SUSP
150.0000 mg | INTRAMUSCULAR | Status: AC
  Administered 2020-09-28 – 2021-07-01 (×4): 150 mg via INTRAMUSCULAR

## 2020-09-28 NOTE — Progress Notes (Addendum)
Wet mount reviewed by provider, patient treated by provider. Depo consent signed, depo given left deltoid, tolerated well, next Depo card given. Patient counseled to tell receptionist that she needs a Pap test and Depo when she calls for her next Depo appointment. Patient states understanding. Patient was given wrong patient history form at front desk, some questions on flow sheet not answered as a result.Burt Knack, RN

## 2020-09-28 NOTE — Progress Notes (Signed)
Patient here for PE. and wants to start Depo. Also interested in vaginal swab, thinks she may have BV. No recent pap records in [place] or [address].Burt Knack, RN

## 2020-09-29 ENCOUNTER — Encounter: Payer: Self-pay | Admitting: Physician Assistant

## 2020-09-29 NOTE — Progress Notes (Signed)
[facility] DEPARTMENT [facility] [address] Main Number: [phone]    Family Planning Visit- Initial Visit  Subjective:  AMBERT VIRRUETA is a 40 y.o.  G1P0010   being seen today for an initial annual visit and to discuss contraceptive options.  The patient is currently using None for pregnancy prevention. Patient reports she does not want a pregnancy in the next year.  Patient has the following medical conditions has Substance induced mood disorder (HCC) and Alcohol abuse on their problem list.  Chief Complaint  Patient presents with  . Contraception    Annual visit and wants to start Depo    Patient reports that she is here for a physical and to restart Depo.  Patient states that she was previously on Depo without problems and thinks her last one was sometime last year.  LMP 09/15/2020 and last sex 08/29/2020.  Patient had IS visit about 3 weeks ago and does not need repeat GC/Chlamydia or blood work today.  Patient thinks she may have BV that is causing her vaginal discharge and urinary symptoms, so self-collected sample for wet mount.  Patient reports that she has lost weight since being off of Depo and wants to gain some of it back.  Reports headaches sometimes that are relieved with rest and Tylenol.  States that she has a time constraint and would like her Depo and to get her pap at her next visit.  Patient denies any other concerns.   Body mass index is 21.96 kg/m. - Patient is eligible for diabetes screening based on BMI and age >59?  not applicable HA1C ordered? not applicable  Patient reports 1  partner/s in last year. Desires STI screening?  No - recent testing all negative.  Has patient been screened once for HCV in the past?  No  No results found for: HCVAB  Does the patient have current drug use (including MJ), have a partner with drug use, and/or has been incarcerated since last result? No  If yes-- Screen for HCV through [facility]   Does the patient meet criteria for HBV testing? No  Criteria:  -Household, sexual or needle sharing contact with HBV -History of drug use -HIV positive -Those with known Hep C   Health Maintenance Due  Topic Date Due  . COVID-19 Vaccine (1) Never done  . TETANUS/TDAP  Never done  . PAP SMEAR-Modifier  Never done    Review of Systems  All other systems reviewed and are negative.   The following portions of the patient's history were reviewed and updated as appropriate: allergies, current medications, past family history, past medical history, past social history, past surgical history and problem list. Problem list updated.   See flowsheet for other program required questions.  Objective:   Vitals:   09/28/20 1602  BP: 108/71  Weight: 133 lb (60.3 kg)  Height: 5' 5.25" (1.657 m)    Physical Exam Vitals and nursing note reviewed.  Constitutional:      General: She is not in acute distress.    Appearance: Normal appearance.  HENT:     Head: Normocephalic and atraumatic.     Mouth/Throat:     Mouth: Mucous membranes are moist.     Pharynx: Oropharynx is clear. No oropharyngeal exudate or posterior oropharyngeal erythema.  Eyes:     Conjunctiva/sclera: Conjunctivae normal.  Cardiovascular:     Rate and Rhythm: Normal rate and regular rhythm.  Pulmonary:  Effort: Pulmonary effort is normal.     Breath sounds: Normal breath sounds.  Musculoskeletal:     Cervical back: Neck supple. No tenderness.  Lymphadenopathy:     Cervical: No cervical adenopathy.  Skin:    General: Skin is warm and dry.     Findings: No bruising, erythema, lesion or rash.  Neurological:     Mental Status: She is alert and oriented to person, place, and time.  Psychiatric:        Mood and Affect: Mood normal.        Behavior: Behavior normal.        Thought Content: Thought content normal.        Judgment: Judgment normal.       Assessment and Plan:  Fraida TKEYA STENCIL  is a 40 y.o. female presenting to the [facility] Department for an initial annual wellness/contraceptive visit  Contraception counseling: Reviewed all forms of birth control options in the tiered based approach. available including abstinence; over the counter/barrier methods; hormonal contraceptive medication including pill, patch, ring, injection,contraceptive implant, ECP; hormonal and nonhormonal IUDs; permanent sterilization options including vasectomy and the various tubal sterilization modalities. Risks, benefits, and typical effectiveness rates were reviewed.  Questions were answered.  Written information was also given to the patient to review.  Patient desires to start Depo today, this was prescribed for patient. She will follow up in  3 months and prn for surveillance.  She was told to call with any further questions, or with any concerns about this method of contraception.  Emphasized use of condoms 100% of the time for STI prevention.  Patient was not a candidate for ECP today.  1. Encounter for counseling regarding contraception Reviewed with patient normal SE of Depo and when to call clinic with concerns. Enc condoms with all sex for 2 weeks after shot today and always for STD protection.   2. Screening for STD (sexually transmitted disease) See results from 09/07/2020 visit.  - WET PREP FOR TRICH, YEAST, CLUE  3. BV (bacterial vaginosis) Will treat for BV with Metronidazole 500 mg #14 1 po BID for 7 days with food, no EtOH for 24 hr before and until 72 hr after completing medicine. No sex for 10 days. Enc to use OTC antifungal cream if has itching during or just after antibiotic use. - metroNIDAZOLE (FLAGYL) 500 MG tablet; Take 1 tablet (500 mg total) by mouth 2 (two) times daily for 7 days.  Dispense: 14 tablet; Refill: 0  4. Initiation of Depo Provera OK for Depo 150 mg IM q 11-13 weeks for 1 year. - medroxyPROGESTERone (DEPO-PROVERA) injection 150 mg     No  follow-ups on file.  No future appointments.  Matt Holmes, PA

## 2020-10-27 ENCOUNTER — Ambulatory Visit: Payer: Self-pay

## 2020-11-11 ENCOUNTER — Encounter: Payer: Self-pay | Admitting: Advanced Practice Midwife

## 2020-11-11 ENCOUNTER — Ambulatory Visit (LOCAL_COMMUNITY_HEALTH_CENTER): Payer: Self-pay | Admitting: Advanced Practice Midwife

## 2020-11-11 ENCOUNTER — Other Ambulatory Visit: Payer: Self-pay

## 2020-11-11 VITALS — BP 115/77 | Ht 65.25 in | Wt 132.4 lb

## 2020-11-11 DIAGNOSIS — F1410 Cocaine abuse, uncomplicated: Secondary | ICD-10-CM | POA: Insufficient documentation

## 2020-11-11 DIAGNOSIS — F17200 Nicotine dependence, unspecified, uncomplicated: Secondary | ICD-10-CM | POA: Insufficient documentation

## 2020-11-11 DIAGNOSIS — Z3009 Encounter for other general counseling and advice on contraception: Secondary | ICD-10-CM

## 2020-11-11 NOTE — Progress Notes (Signed)
Pt to clinic for pap and STD screening.

## 2020-11-11 NOTE — Progress Notes (Signed)
   [facility]  Subjective:  Karen Crane is a 40 y.o. SWF G1P0 smoker being seen today for pap, pelvic. Last PE 09/28/20. First and last DMPA 09/28/20. Last sex 10/04/20 without condom; with current partner >1 year; 1 partner in last 3 mo. Last pap ? [place] 5-10 years ago?. Last MJ 06/2020. Last cocaine 06/2020. Last ETOH last night 1/2 pint vodka daily. Declines ETOH cessation assistance. Not working, not in school, living alone. Smoking 1/2-1 ppd.  DWI 06/2020.   Chief Complaint  Patient presents with   Gynecologic Exam    Pap smear and STD screening    HPI   Does the patient have a current or past history of drug use? Yes   No components found for: HCV]   Health Maintenance Due  Topic Date Due   COVID-19 Vaccine (1) Never done   Pneumococcal Vaccine 36-54 Years old (1 - PCV) Never done   TETANUS/TDAP  Never done   PAP SMEAR-Modifier  Never done    Review of Systems  All other systems reviewed and are negative.  The following portions of the patient's history were reviewed and updated as appropriate: allergies, current medications, past family history, past medical history, past social history, past surgical history and problem list. Problem list updated.   See flowsheet for other program required questions.  Objective:   Vitals:   11/11/20 1538  BP: 115/77  Weight: 132 lb 6.4 oz (60.1 kg)  Height: 5' 5.25" (1.657 m)    Physical Exam Vitals and nursing note reviewed.  Constitutional:      Appearance: Normal appearance. She is normal weight.  HENT:     Head: Normocephalic.  Eyes:     Conjunctiva/sclera: Conjunctivae normal.  Pulmonary:     Effort: Pulmonary effort is normal.  Abdominal:     Palpations: Abdomen is soft.  Genitourinary:    General: Normal vulva.     Exam position: Lithotomy position.     Vagina: Vaginal discharge (brown leukorrhea, ph>4.5) present.     Cervix: Normal.      Uterus: Normal.      Adnexa: Right adnexa normal and left adnexa normal.     Rectum: Normal.     Comments: Pap done Musculoskeletal:        General: Normal range of motion.  Skin:    General: Skin is warm and dry.  Neurological:     Mental Status: She is alert. Mental status is at baseline.      Assessment and Plan:  Karen Crane is a 40 y.o. female presenting to the [facility] Department for a Women's Health problem visit  1. Family planning Treat wet mount per standing orders Immunization nurse consult Please give BCCCP # to pt - WET PREP FOR TRICH, YEAST, CLUE - Chlamydia/Gonorrhea [facility] - IGP, Aptima HPV     No follow-ups on file.  No future appointments.  Alberteen Spindle, CNM

## 2020-11-11 NOTE — Progress Notes (Signed)
Wet mount reviewed, no tx per standing orders. Provider orders completed. 

## 2020-11-12 LAB — WET PREP FOR TRICH, YEAST, CLUE
Trichomonas Exam: NEGATIVE
Yeast Exam: NEGATIVE

## 2020-11-15 ENCOUNTER — Encounter: Payer: Self-pay | Admitting: Advanced Practice Midwife

## 2020-11-15 LAB — IGP, APTIMA HPV
HPV Aptima: NEGATIVE
PAP Smear Comment: 0

## 2020-11-18 ENCOUNTER — Encounter: Payer: Self-pay | Admitting: Advanced Practice Midwife

## 2020-11-19 ENCOUNTER — Telehealth: Payer: Self-pay | Admitting: Family Medicine

## 2020-11-19 NOTE — Telephone Encounter (Signed)
Patient called about her test results from her previous visit.

## 2020-12-28 ENCOUNTER — Other Ambulatory Visit: Payer: Self-pay

## 2020-12-28 ENCOUNTER — Ambulatory Visit: Payer: Self-pay

## 2020-12-31 ENCOUNTER — Other Ambulatory Visit: Payer: Self-pay

## 2020-12-31 ENCOUNTER — Ambulatory Visit (LOCAL_COMMUNITY_HEALTH_CENTER): Payer: Self-pay | Admitting: Physician Assistant

## 2020-12-31 VITALS — BP 104/69 | Ht 62.25 in | Wt 134.0 lb

## 2020-12-31 DIAGNOSIS — Z3009 Encounter for other general counseling and advice on contraception: Secondary | ICD-10-CM

## 2020-12-31 DIAGNOSIS — Z30013 Encounter for initial prescription of injectable contraceptive: Secondary | ICD-10-CM

## 2020-12-31 DIAGNOSIS — Z3042 Encounter for surveillance of injectable contraceptive: Secondary | ICD-10-CM

## 2020-12-31 NOTE — Progress Notes (Signed)
   [facility]  [facility]  Subjective:  Karen Crane is a 40 y.o. being seen today for Depo.  No chief complaint on file.   HPI Patient into clinic for Depo today.  Per chart review, last shot was 09/28/2020, and last sex was over 3 months ago before her last shot.  Denies questions or concerns.   Does the patient have a current or past history of drug use? No   No components found for: HCV]   Health Maintenance Due  Topic Date Due   COVID-19 Vaccine (1) Never done   Pneumococcal Vaccine 45-21 Years old (1 - PCV) Never done   TETANUS/TDAP  Never done   INFLUENZA VACCINE  Never done    Review of Systems  All other systems reviewed and are negative.  The following portions of the patient's history were reviewed and updated as appropriate: allergies, current medications, past family history, past medical history, past social history, past surgical history and problem list. Problem list updated.   See flowsheet for other program required questions.  Objective:   Vitals:   12/31/20 1530  BP: 104/69  Weight: 134 lb (60.8 kg)  Height: 5' 2.25" (1.581 m)    Physical Exam Vitals reviewed.  Constitutional:      General: She is not in acute distress.    Appearance: Normal appearance.  HENT:     Head: Normocephalic and atraumatic.  Eyes:     Conjunctiva/sclera: Conjunctivae normal.  Pulmonary:     Effort: Pulmonary effort is normal.  Neurological:     Mental Status: She is alert and oriented to person, place, and time.  Psychiatric:        Mood and Affect: Mood normal.        Behavior: Behavior normal.        Thought Content: Thought content normal.        Judgment: Judgment normal.      Assessment and Plan:  Karen Crane is a 40 y.o. female presenting to the [facility] Department for a Women's Health problem visit  1. Encounter for counseling regarding contraception Patient here to continue  with Depo per  09/28/2020 order. Reviewed SE of Depo and when to call clinic for concerns. Enc condoms with all sex for STD protection.  2. Surveillance for Depo-Provera contraception Depo given in R deltoid today per above order.     Return in about 11 weeks (around 03/18/2021) for depo.  Future Appointments  Date Time Provider Department Center  01/07/2021  8:50 AM [facility] NURSE [facility] None    Matt Holmes, PA

## 2021-01-07 ENCOUNTER — Other Ambulatory Visit: Payer: Self-pay

## 2021-01-14 ENCOUNTER — Other Ambulatory Visit: Payer: Self-pay

## 2021-01-14 ENCOUNTER — Ambulatory Visit: Payer: Self-pay

## 2021-01-28 ENCOUNTER — Other Ambulatory Visit: Payer: Self-pay

## 2021-02-11 ENCOUNTER — Other Ambulatory Visit: Payer: Self-pay

## 2021-02-25 ENCOUNTER — Ambulatory Visit (LOCAL_COMMUNITY_HEALTH_CENTER): Payer: Self-pay

## 2021-02-25 ENCOUNTER — Other Ambulatory Visit: Payer: Self-pay

## 2021-02-25 ENCOUNTER — Ambulatory Visit: Payer: Self-pay | Admitting: Family Medicine

## 2021-02-25 DIAGNOSIS — Z111 Encounter for screening for respiratory tuberculosis: Secondary | ICD-10-CM

## 2021-02-25 DIAGNOSIS — Z113 Encounter for screening for infections with a predominantly sexual mode of transmission: Secondary | ICD-10-CM

## 2021-02-25 LAB — WET PREP FOR TRICH, YEAST, CLUE
Trichomonas Exam: NEGATIVE
Yeast Exam: NEGATIVE

## 2021-02-25 NOTE — Progress Notes (Signed)
Pt here for STD screening.  Pt c/o vaginal itching.  Denies any pain or vaginal discharge.  Wet mount results reviewed, no treatment required.  Condoms declined.  Pt informed to call with questions or concerns. Berdie Ogren, RN

## 2021-02-28 ENCOUNTER — Ambulatory Visit (LOCAL_COMMUNITY_HEALTH_CENTER): Payer: Self-pay

## 2021-02-28 ENCOUNTER — Other Ambulatory Visit: Payer: Self-pay

## 2021-02-28 DIAGNOSIS — Z111 Encounter for screening for respiratory tuberculosis: Secondary | ICD-10-CM

## 2021-02-28 LAB — TB SKIN TEST
Induration: 0 mm
TB Skin Test: NEGATIVE

## 2021-03-07 NOTE — Progress Notes (Signed)
Attestation of Attending Supervision of clinical support staff: I agree with the care provided to this patient and was available for any consultation.  I have reviewed the RN's note and chart. I was available for consult. I was off site on the date of service and all providers that would typically provide services called off. I was consulted to assure appropriate continuation of services for [facility] clients.    Federico Flake, MD, MPH, [facility] Medical Director

## 2021-03-21 ENCOUNTER — Ambulatory Visit: Payer: Self-pay

## 2021-04-05 ENCOUNTER — Encounter: Payer: Self-pay | Admitting: Family Medicine

## 2021-04-05 ENCOUNTER — Other Ambulatory Visit: Payer: Self-pay

## 2021-04-05 ENCOUNTER — Ambulatory Visit (LOCAL_COMMUNITY_HEALTH_CENTER): Payer: Self-pay

## 2021-04-05 ENCOUNTER — Ambulatory Visit: Payer: Self-pay | Admitting: Family Medicine

## 2021-04-05 VITALS — BP 121/76 | Ht 62.24 in | Wt 133.5 lb

## 2021-04-05 DIAGNOSIS — Z3009 Encounter for other general counseling and advice on contraception: Secondary | ICD-10-CM

## 2021-04-05 DIAGNOSIS — Z30013 Encounter for initial prescription of injectable contraceptive: Secondary | ICD-10-CM

## 2021-04-05 DIAGNOSIS — Z3042 Encounter for surveillance of injectable contraceptive: Secondary | ICD-10-CM

## 2021-04-05 DIAGNOSIS — N898 Other specified noninflammatory disorders of vagina: Secondary | ICD-10-CM

## 2021-04-05 DIAGNOSIS — Z113 Encounter for screening for infections with a predominantly sexual mode of transmission: Secondary | ICD-10-CM

## 2021-04-05 LAB — WET PREP FOR TRICH, YEAST, CLUE
Trichomonas Exam: NEGATIVE
Yeast Exam: NEGATIVE

## 2021-04-05 MED ORDER — METRONIDAZOLE 500 MG PO TABS: 500.0000 mg | ORAL_TABLET | Freq: Two times a day (BID) | ORAL | 0 refills | Status: DC

## 2021-04-05 NOTE — Progress Notes (Signed)
In Nurse Clinic today at 1:35 pm for depo. 13 weeks 4 days post depo. Reports vaginal itching with thick white discharge and bad odor for one month. Pt thinks she has BV. Pt explains she has transportation difficulties and does not have PCP presently. RN gave pt list of local provider resources. Consult Ralene Bathe, MD who agrees to see pt this afternoon. Pt in agreement. Depo was given in Nurse Clinic per order by Beatris Si PA dated 09/28/20. Next depo due 06/21/21, pt has reminder. RN walked pt to [facility] for Dr. Alvester Morin to see pt. Jerel Shepherd, RN

## 2021-04-05 NOTE — Progress Notes (Signed)
[facility] Department  STI clinic/screening visit [address] [place] [address] [phone]  Subjective:  Karen Crane is a 40 y.o. female being seen today for an STI screening visit. The patient reports they do have symptoms.  Patient reports that they do not desire a pregnancy in the next year.   They reported they are not interested in discussing contraception today.    No LMP recorded.  Patient has the following medical conditions:   Patient Active Problem List   Diagnosis Date Noted   Smoker 1/2-1 ppd 11/11/2020   Cocaine abuse (HCC) last use 06/2020 11/11/2020   Substance induced mood disorder (HCC) 06/18/2020   Alcohol abuse daily with DWI 06/2020 06/18/2020    Chief Complaint  Patient presents with   Contraception    depo   Vaginal Discharge   SEXUALLY TRANSMITTED DISEASE    Screening    HPI  Patient reports vaginal discharge, thick, white and odorous. + itching and burning.  Reports cramping.  Denies bleeding. Started in October. Screening in Oct was negative.  Has seen specialist. No recent abx but did take 4 metronidazole pills in October.  Has been treated for trich in the past.   Here originally for DEPO and reported STI sx.   Last HIV test per patient/review of record was 08/2020 Patient reports last pap was 10/2020.   Declines blood work screening  Screening for MPX risk: Does the patient have an unexplained rash? No Is the patient MSM? No Does the patient endorse multiple sex partners or anonymous sex partners? No Did the patient have close or sexual contact with a person diagnosed with MPX? No Has the patient traveled outside the [place] where MPX is endemic? No Is there a high clinical suspicion for MPX-- evidenced by one of the following No  -Unlikely to be chickenpox  -Lymphadenopathy  -Rash that present in same phase of evolution on any given body part See flowsheet for further details and programmatic requirements.     The following portions of the patient's history were reviewed and updated as appropriate: allergies, current medications, past medical history, past social history, past surgical history and problem list.  Objective:  There were no vitals filed for this visit.  Physical Exam Vitals and nursing note reviewed.  Constitutional:      Appearance: Normal appearance.  HENT:     Head: Normocephalic and atraumatic.     Mouth/Throat:     Mouth: Mucous membranes are moist.     Pharynx: Oropharynx is clear. No oropharyngeal exudate or posterior oropharyngeal erythema.  Pulmonary:     Effort: Pulmonary effort is normal.  Abdominal:     General: Abdomen is flat.     Palpations: There is no mass.     Tenderness: There is no abdominal tenderness. There is no rebound.  Genitourinary:    General: Normal vulva.     Exam position: Lithotomy position.     Pubic Area: No rash or pubic lice.      Labia:        Right: No rash or lesion.        Left: No rash or lesion.      Vagina: Vaginal discharge (creamy yellow, copious. ph>4.5) present. No erythema, bleeding or lesions.     Cervix: No cervical motion tenderness, discharge, friability, lesion or erythema.     Uterus: Normal.      Adnexa: Right adnexa normal and left adnexa normal.     Rectum:  Normal.  Lymphadenopathy:     Head:     Right side of head: No preauricular or posterior auricular adenopathy.     Left side of head: No preauricular or posterior auricular adenopathy.     Cervical: No cervical adenopathy.     Upper Body:     Right upper body: No supraclavicular or axillary adenopathy.     Left upper body: No supraclavicular or axillary adenopathy.     Lower Body: No right inguinal adenopathy. No left inguinal adenopathy.  Skin:    General: Skin is warm and dry.     Findings: No rash.  Neurological:     Mental Status: She is alert and oriented to person, place, and time.     Assessment and Plan:  Karen Crane is a 40 y.o.  female presenting to the [facility] Department for STI screening   1. Vaginal discharge - WET PREP FOR TRICH, YEAST, CLUE - Wet mount + amine, no clue or trich but given sx will treat with metronizaole 500mg  BID x 7 days.   The patient was dispensed metronidazole today. I provided counseling today regarding the medication. We discussed the medication, the side effects and when to call clinic. Patient given the opportunity to ask questions. Questions answered.    2. Screening examination for venereal disease Patient accepted all screenings including vaginal CT/GC and  declined bloodwork for HIV/RPR.  Patient meets criteria for HepB screening? Yes. Ordered? No - declined blood work Patient meets criteria for HepC screening? No. Ordered? No - declined bloodwork  Treat wet prep per standing order Discussed time line for State Lab results and that patient will be called with positive results and encouraged patient to call if she had not heard in 2 weeks.  Counseled to return or seek care for continued or worsening symptoms Recommended condom use with all sex  Patient is currently using Hormonal Contraception: Injection, Rings and Patches to prevent pregnancy.   - Chlamydia/Gonorrhea [facility]   Return if symptoms worsen or fail to improve.  No future appointments.  , MD

## 2021-05-31 ENCOUNTER — Encounter: Payer: Self-pay | Admitting: Family Medicine

## 2021-05-31 ENCOUNTER — Ambulatory Visit: Payer: Self-pay | Admitting: Family Medicine

## 2021-05-31 ENCOUNTER — Other Ambulatory Visit: Payer: Self-pay

## 2021-05-31 DIAGNOSIS — Z202 Contact with and (suspected) exposure to infections with a predominantly sexual mode of transmission: Secondary | ICD-10-CM

## 2021-05-31 DIAGNOSIS — Z113 Encounter for screening for infections with a predominantly sexual mode of transmission: Secondary | ICD-10-CM

## 2021-05-31 DIAGNOSIS — A599 Trichomoniasis, unspecified: Secondary | ICD-10-CM

## 2021-05-31 LAB — WET PREP FOR TRICH, YEAST, CLUE
Trichomonas Exam: POSITIVE — AB
Yeast Exam: NEGATIVE

## 2021-05-31 MED ORDER — DOXYCYCLINE HYCLATE 100 MG PO TABS: 100.0000 mg | ORAL_TABLET | Freq: Two times a day (BID) | ORAL | 0 refills | Status: AC

## 2021-05-31 MED ORDER — METRONIDAZOLE 500 MG PO TABS: 500.0000 mg | ORAL_TABLET | Freq: Two times a day (BID) | ORAL | 0 refills | Status: AC

## 2021-05-31 MED ORDER — CLOTRIMAZOLE 1 % VA CREA: 1.0000 | TOPICAL_CREAM | Freq: Every day | VAGINAL | 0 refills | Status: AC

## 2021-05-31 NOTE — Patient Instructions (Addendum)
Steps to prevent BV and yeast: Wear all-cotton underwear Sleep without underwear Take showers instead of baths Wear loose fitting clothing, especially during warm/hot weather Use a hair dryer on low after bathing to dry the area Avoid scented soaps and body washes Do not douche May try over the counter probiotics or boric acid gel or suppositories Stop smoking Steps to prevent BV and yeast: Wear all-cotton underwear Sleep without underwear Take showers instead of baths Wear loose fitting clothing, especially during warm/hot weather Use a hair dryer on low after bathing to dry the area Avoid scented soaps and body washes Do not douche May try over the counter probiotics or boric acid gel or suppositories Stop smoking  

## 2021-05-31 NOTE — Progress Notes (Signed)
Pt here for STD screening and as contact to Chlamydia and possibly Trich.  Wet mount results reviewed.  Medication dispensed per Provider orders.  PCP list, Substance Abuse information and Kathreen Cosier card given to pt.  Berdie Ogren, RN

## 2021-05-31 NOTE — Progress Notes (Signed)
[facility] Department  STI clinic/screening visit [place] [address] [phone]  Subjective:  Karen Crane is a 41 y.o. female being seen today for an STI screening visit. The patient reports they do have symptoms.  Patient reports that they do not desire a pregnancy in the next year.   They reported they are not interested in discussing contraception today.    No LMP recorded. Patient has had an injection.   Patient has the following medical conditions:   Patient Active Problem List   Diagnosis Date Noted   Smoker 1/2-1 ppd 11/11/2020   Cocaine abuse ([place]) last use 06/2020 11/11/2020   Substance induced mood disorder ([place]) 06/18/2020   Alcohol abuse daily with DWI 06/2020 06/18/2020    Chief Complaint  Patient presents with   Exposure to STD    Screening and Tx    HPI  Patient reports here for screening,   Last HIV test per patient/review of record was 09/07/2020 Patient reports last pap was 11/11/2020.   Screening for MPX risk: Does the patient have an unexplained rash? No Is the patient MSM? No Does the patient endorse multiple sex partners or anonymous sex partners? No Did the patient have close or sexual contact with a person diagnosed with MPX? No Has the patient traveled outside the [place] where MPX is endemic? No Is there a high clinical suspicion for MPX-- evidenced by one of the following No  -Unlikely to be chickenpox  -Lymphadenopathy  -Rash that present in same phase of evolution on any given body part See flowsheet for further details and programmatic requirements.    The following portions of the patient's history were reviewed and updated as appropriate: allergies, current medications, past medical history, past social history, past surgical history and problem list.  Objective:  There were no vitals filed for this visit.  Physical Exam Vitals and nursing note reviewed.  Constitutional:      Appearance:  Normal appearance.  HENT:     Head: Normocephalic and atraumatic.     Mouth/Throat:     Mouth: Mucous membranes are moist.     Pharynx: Oropharynx is clear. No oropharyngeal exudate or posterior oropharyngeal erythema.  Pulmonary:     Effort: Pulmonary effort is normal.  Abdominal:     General: Abdomen is flat.     Palpations: There is no mass.     Tenderness: There is no abdominal tenderness. There is no rebound.  Genitourinary:    General: Normal vulva.     Exam position: Lithotomy position.     Pubic Area: No rash or pubic lice.      Labia:        Right: No rash or lesion.        Left: No rash or lesion.      Vagina: Normal. No vaginal discharge, erythema, bleeding or lesions.     Cervix: No cervical motion tenderness, discharge, friability, lesion or erythema.     Uterus: Normal.      Adnexa: Right adnexa normal and left adnexa normal.     Rectum: Normal.     Comments: External genitalia without, lice, nits, erythema, edema , lesions or inguinal adenopathy. Vagina with normal mucosa and discharge and pH > 4.  Cervix without visual lesions, uterus firm, mobile, non-tender, no masses, CMT adnexal fullness or tenderness.   Lymphadenopathy:     Head:     Right side of head: No preauricular or posterior auricular adenopathy.  Left side of head: No preauricular or posterior auricular adenopathy.     Cervical: No cervical adenopathy.     Upper Body:     Right upper body: No supraclavicular or axillary adenopathy.     Left upper body: No supraclavicular or axillary adenopathy.     Lower Body: No right inguinal adenopathy. No left inguinal adenopathy.  Skin:    General: Skin is warm and dry.     Findings: No rash.  Neurological:     Mental Status: She is alert and oriented to person, place, and time.     Assessment and Plan:  Karen Crane is a 41 y.o. female presenting to the [facility] Department for STI screening  1. Screening examination for venereal  disease Patient accepted all screenings including wet prep, oral, vaginal CT/GC and declined bloodwork for HIV/RPR.  Patient meets criteria for HepB screening? Yes. Ordered? No - declined  Patient meets criteria for HepC screening? Yes. Ordered? No - declined   Wet prep results + BV, + Trich    Treatment needed  Discussed time line for State Lab results and that patient will be called with positive results and encouraged patient to call if she had not heard in 2 weeks.  Counseled to return or seek care for continued or worsening symptoms Recommended condom use with all sex  Patient is currently using  Depo  to prevent pregnancy.    - Chlamydia/Gonorrhea [place] [facility] - WET PREP FOR [place], YEAST, CLUE - Chlamydia/Gonorrhea [place] [facility] - clotrimazole (V-R CLOTRIMAZOLE VAGINAL) 1 % vaginal cream; Place 1 Applicatorful vaginally at bedtime for 7 days.  Dispense: 45 g; Refill: 0  2. Chlamydia contact  - doxycycline (VIBRA-TABS) 100 MG tablet; Take 1 tablet (100 mg total) by mouth 2 (two) times daily for 7 days.  Dispense: 14 tablet; Refill: 0  3. Trichimoniasis  - metroNIDAZOLE (FLAGYL) 500 MG tablet; Take 1 tablet (500 mg total) by mouth 2 (two) times daily for 7 days.  Dispense: 14 tablet; Refill: 0     Return in about 4 weeks (around 06/28/2021) for as needed, Test of Cure.  No future appointments.  Junious Dresser, FNP

## 2021-07-01 ENCOUNTER — Ambulatory Visit: Payer: Self-pay

## 2021-07-01 ENCOUNTER — Ambulatory Visit (LOCAL_COMMUNITY_HEALTH_CENTER): Payer: Self-pay | Admitting: Family Medicine

## 2021-07-01 ENCOUNTER — Other Ambulatory Visit: Payer: Self-pay

## 2021-07-01 VITALS — BP 114/71 | Ht 65.0 in | Wt 131.0 lb

## 2021-07-01 DIAGNOSIS — Z30013 Encounter for initial prescription of injectable contraceptive: Secondary | ICD-10-CM

## 2021-07-01 DIAGNOSIS — Z309 Encounter for contraceptive management, unspecified: Secondary | ICD-10-CM

## 2021-07-01 DIAGNOSIS — Z113 Encounter for screening for infections with a predominantly sexual mode of transmission: Secondary | ICD-10-CM

## 2021-07-01 DIAGNOSIS — Z3042 Encounter for surveillance of injectable contraceptive: Secondary | ICD-10-CM

## 2021-07-01 LAB — WET PREP FOR TRICH, YEAST, CLUE
Trichomonas Exam: NEGATIVE
Yeast Exam: NEGATIVE

## 2021-07-01 NOTE — Progress Notes (Signed)
? ?[place] problem visit  ?[place] Department ? ?Subjective:  ?Karen Crane is a 41 y.o. being seen today for  ? ?Chief Complaint  ?Patient presents with  ? Contraception  ?  Depo and STD check  ? ? ?HPI ? ? ?Does the patient have a current or past history of drug use? Yes  ? No components found for: HCV] ? ? ?Health Maintenance Due  ?Topic Date Due  ? COVID-19 Vaccine (1) Never done  ? TETANUS/TDAP  Never done  ? INFLUENZA VACCINE  Never done  ? ? ?ROS ? ?The following portions of the patient's history were reviewed and updated as appropriate: allergies, current medications, past family history, past medical history, past social history, past surgical history and problem list. Problem list updated. ? ? ?See flowsheet for other program required questions. ? ?Objective:  ? ?Vitals:  ? 07/01/21 0832  ?BP: 114/71  ?Weight: 131 lb (59.4 kg)  ?Height: 5\' 5"  (1.651 m)  ? ? ?Physical Exam ?Vitals and nursing note reviewed.  ?Constitutional:   ?   Appearance: Normal appearance.  ?HENT:  ?   Head: Normocephalic and atraumatic.  ?   Mouth/Throat:  ?   Mouth: Mucous membranes are moist.  ?   Pharynx: Oropharynx is clear. No oropharyngeal exudate or posterior oropharyngeal erythema.  ?Pulmonary:  ?   Effort: Pulmonary effort is normal.  ?Abdominal:  ?   General: Abdomen is flat.  ?   Palpations: There is no mass.  ?   Tenderness: There is no abdominal tenderness. There is no rebound.  ?Genitourinary: ?   Exam position: Lithotomy position.  ?   Pubic Area: No rash or pubic lice.   ?   Labia:     ?   Right: No rash or lesion.     ?   Left: No rash or lesion.   ?   Vagina: No erythema, bleeding or lesions.  ?   Cervix: No cervical motion tenderness, discharge, friability, lesion or erythema.  ?   Uterus: Normal.   ?   Adnexa: Right adnexa normal and left adnexa normal.  ?   Comments: Deferred pt desires to self collect  ?Musculoskeletal:  ?   Cervical back: Normal range of motion and neck  supple.  ?Lymphadenopathy:  ?   Head:  ?   Right side of head: No preauricular or posterior auricular adenopathy.  ?   Left side of head: No preauricular or posterior auricular adenopathy.  ?   Cervical: No cervical adenopathy.  ?   Upper Body:  ?   Right upper body: No supraclavicular or axillary adenopathy.  ?   Left upper body: No supraclavicular or axillary adenopathy.  ?   Lower Body: No right inguinal adenopathy. No left inguinal adenopathy.  ?Skin: ?   General: Skin is warm and dry.  ?   Findings: No rash.  ?Neurological:  ?   Mental Status: She is alert and oriented to person, place, and time.  ?Psychiatric:     ?   Mood and Affect: Mood normal.     ?   Behavior: Behavior normal.  ? ? ? ? ?Assessment and Plan:  ?Karen Crane is a 41 y.o. female presenting to the [facility] Department for a Women's Health problem visit ? ?1. Screening examination for venereal disease ?Pt in clinic  for TOC, pt declines any other testing, desires wet prep only.  ?Denies any s/sx ? Wet  prep negative, no treatment needed.  ?- WET PREP FOR [place], YEAST, CLUE ? ?2. Surveillance for Depo-Provera contraception ? Pt due for Depo, see RN note  ? ?Return in about 3 months (around 10/01/2021) for depo. ? ?No future appointments. ? ?Junious Dresser, FNP ? ?

## 2021-07-01 NOTE — Progress Notes (Signed)
Pt here for Depo injection and repeat Wet prep.   ?Wet prep results reviewed, no treatment required per SO.  Depo 150 mg given IM in Rt Deltoid without any complications.  Pt given reminder card to return in 11-13 weeks for next Depo.  Berdie Ogren, RN ? ?
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
## 2021-09-27 ENCOUNTER — Emergency Department: Payer: Self-pay

## 2021-09-27 ENCOUNTER — Emergency Department
Admission: EM | Admit: 2021-09-27 | Discharge: 2021-09-28 | Disposition: A | Discharge status: Home / Self Care | Payer: Self-pay | Attending: Emergency Medicine | Admitting: Emergency Medicine

## 2021-09-27 ENCOUNTER — Encounter: Payer: Self-pay | Admitting: *Deleted

## 2021-09-27 ENCOUNTER — Other Ambulatory Visit: Payer: Self-pay

## 2021-09-27 DIAGNOSIS — J40 Bronchitis, not specified as acute or chronic: Secondary | ICD-10-CM | POA: Insufficient documentation

## 2021-09-27 DIAGNOSIS — N3000 Acute cystitis without hematuria: Secondary | ICD-10-CM | POA: Insufficient documentation

## 2021-09-27 DIAGNOSIS — Z20822 Contact with and (suspected) exposure to covid-19: Secondary | ICD-10-CM | POA: Insufficient documentation

## 2021-09-27 LAB — RESP PANEL BY RT-PCR (FLU A&B, COVID) ARPGX2
Influenza A by PCR: NEGATIVE
Influenza B by PCR: NEGATIVE
SARS Coronavirus 2 by RT PCR: NEGATIVE

## 2021-09-27 LAB — POC URINE PREG, ED: Preg Test, Ur: NEGATIVE

## 2021-09-27 NOTE — ED Triage Notes (Signed)
Pt has a cough for 2 weeks,  no taste or smell.  Pt also has dysuria x 2 days.   Pt alert  speech clear.

## 2021-09-27 NOTE — ED Provider Notes (Signed)
[facility] Provider Note    Event Date/Time   First MD Initiated Contact with Patient 09/27/21 2052     (approximate)   History   Chief Complaint Cough   HPI Karen Crane is a 41 y.o. female, history of substance-induced mood disorder, cocaine abuse, alcohol abuse, presents to the emergency department for evaluation of cough and dysuria.  Patient states that she has been experiencing cough for the past 2 weeks, and is experiencing a recent loss in taste/smell.  Patient is additionally experiencing dysuria for the past 2 days.  Reports frequent urinary tract infections, and states that this feels like one of them.  Denies fever/chills, chest pain, shortness of breath, abdominal pain, flank pain, nausea/vomiting, diarrhea, headache, rash/lesions, or dizziness/lightheadedness.  History Limitations: No limitations.        Physical Exam  Triage Vital Signs: ED Triage Vitals [09/27/21 2001]  Enc Vitals Group     BP (!) 162/100     Pulse Rate 98     Resp 20     Temp 98.4 F (36.9 C)     Temp Source Oral     SpO2 97 %     Weight 140 lb (63.5 kg)     Height 5\' 6"  (1.676 m)     Head Circumference      Peak Flow      Pain Score 5     Pain Loc      Pain Edu?      Excl. in [place]?     Most recent vital signs: Vitals:   09/27/21 2001 09/27/21 2333  BP: (!) 162/100 131/90  Pulse: 98 78  Resp: 20 20  Temp: 98.4 F (36.9 C)   SpO2: 97% 97%    General: Awake, NAD.  Actively coughing in the room. Skin: Warm, dry. No rashes or lesions.  Eyes: PERRL. Conjunctivae normal.  CV: Good peripheral perfusion.  Resp: Normal effort.  Lung sounds are clear bilaterally in the apices bases Abd: Soft, non-tender. No distention.  No CVA tenderness Neuro: At baseline. No gross neurological deficits.   Focused Exam: N/A.  Physical Exam    ED Results / Procedures / Treatments  Labs (all labs ordered are listed, but only abnormal results are displayed) Labs  Reviewed  URINALYSIS, ROUTINE W REFLEX MICROSCOPIC - Abnormal; Notable for the following components:      Result Value   Color, Urine AMBER (*)    APPearance CLOUDY (*)    Hgb urine dipstick LARGE (*)    Protein, ur 100 (*)    Leukocytes,Ua SMALL (*)    RBC / HPF >50 (*)    WBC, UA >50 (*)    Bacteria, UA MANY (*)    All other components within normal limits  RESP PANEL BY RT-PCR (FLU A&B, COVID) [id number]  POC URINE PREG, ED     EKG N/A   RADIOLOGY  ED Provider Interpretation: I personally viewed and interpreted the chest x-ray, no evidence of acute cardiopulmonary abnormalities  DG Chest 1 View  Result Date: 09/27/2021 CLINICAL DATA:  Cough for the past 2 weeks. EXAM: CHEST  1 VIEW COMPARISON:  CT chest and chest x-ray dated June 18, 2020. FINDINGS: The heart size and mediastinal contours are within normal limits. Both lungs are clear. The visualized skeletal structures are unremarkable. IMPRESSION: No active disease. Electronically Signed   By: Titus Dubin M.D.   On: 09/27/2021 20:25    PROCEDURES:  Critical Care performed: N/A  Procedures    MEDICATIONS ORDERED IN ED: Medications - No data to display   IMPRESSION / MDM / [place] / ED COURSE  I reviewed the triage vital signs and the nursing notes.                              Differential diagnosis includes, but is not limited to, COVID-19 ,influenza, bronchitis, pneumonia, cystitis, viral URI.  ED Course Patient appears well, NAD.  Urine pregnancy negative.  Respiratory panel negative for COVID-19 or influenza.  Assessment/Plan Presentation consistent with bronchitis.  Chest x-ray reassuring for no evidence of pneumonia.  Respiratory panel negative for COVID-19 or influenza.  We will provide her with a prescription for benzonatate to take.  Patient is also found to have urinary tract infection.  She states that cephalexin has not worked well for her in the past.  We will provide her with a  prescription for Bactrim.  Advised her to follow-up with her primary care provider within the next week as needed if her symptoms further improved.  We will plan to discharge  Patient's presentation is most consistent with acute complicated illness / injury requiring diagnostic workup.   Provided the patient with anticipatory guidance, return precautions, and educational material. Encouraged the patient to return to the emergency department at any time if they begin to experience any new or worsening symptoms. Patient expressed understanding and agreed with the plan.       FINAL CLINICAL IMPRESSION(S) / ED DIAGNOSES   Final diagnoses:  Bronchitis  Acute cystitis without hematuria     Rx / DC Orders   ED Discharge Orders          Ordered    benzonatate (TESSALON PERLES) 100 MG capsule  3 times daily PRN        09/28/21 0013    sulfamethoxazole-trimethoprim (BACTRIM DS) 800-160 MG tablet  2 times daily        09/28/21 0013             Note:  This document was prepared using Dragon voice recognition software and may include unintentional dictation errors.   Teodoro Spray, [place] 09/28/21 Dairl Ponder    Naaman Plummer, MD 10/05/21 1600

## 2021-09-27 NOTE — ED Provider Triage Note (Signed)
Emergency Medicine Provider Triage Evaluation Note  Karen Crane , a 41 y.o. female  was evaluated in triage.  Pt complains of cough x 2 weeks with loss of taste and smell today. She is also having burning with urination x 2 days. No known fever..  Review of Systems  Positive: Cough, dysuria, nasal congestion Negative: Fever  Physical Exam  BP (!) 162/100 (BP Location: Left Arm)   Pulse 98   Temp 98.4 F (36.9 C) (Oral)   Resp 20   Ht 5\' 6"  (1.676 m)   Wt 63.5 kg   LMP  (LMP Unknown)   SpO2 97%   BMI 22.60 kg/m  Gen:   Awake, no distress   Resp:  Normal effort  MSK:   Moves extremities without difficulty  Other:    Medical Decision Making  Medically screening exam initiated at 8:06 PM.  Appropriate orders placed.  Karen Crane was informed that the remainder of the evaluation will be completed by another provider, this initial triage assessment does not replace that evaluation, and the importance of remaining in the ED until their evaluation is complete.    Karen Leo, FNP 09/27/21 2314

## 2021-09-28 LAB — URINALYSIS, ROUTINE W REFLEX MICROSCOPIC
Bilirubin Urine: NEGATIVE
Glucose, UA: NEGATIVE mg/dL
Ketones, ur: NEGATIVE mg/dL
Nitrite: NEGATIVE
Protein, ur: 100 mg/dL — AB
RBC / HPF: >50 RBC/hpf — ABNORMAL HIGH (ref 0–5)
Specific Gravity, Urine: 1.012 (ref 1.005–1.030)
WBC, UA: >50 WBC/hpf — ABNORMAL HIGH (ref 0–5)
pH: 6 (ref 5.0–8.0)

## 2021-09-28 MED ORDER — BENZONATATE 100 MG PO CAPS: 100.0000 mg | ORAL_CAPSULE | Freq: Three times a day (TID) | ORAL | 0 refills | Status: AC | PRN

## 2021-09-28 MED ORDER — SULFAMETHOXAZOLE-TRIMETHOPRIM 800-160 MG PO TABS: 1.0000 | ORAL_TABLET | Freq: Two times a day (BID) | ORAL | 0 refills | Status: AC

## 2021-09-28 NOTE — Discharge Instructions (Addendum)
-  Take all of your antibiotics as prescribed.  -You may take the benzonatate and utilize your albuterol inhaler as needed for cough.  -Follow-up with your primary care provider as needed

## 2021-10-07 ENCOUNTER — Ambulatory Visit: Payer: Self-pay

## 2021-10-18 ENCOUNTER — Ambulatory Visit (LOCAL_COMMUNITY_HEALTH_CENTER): Payer: Self-pay | Admitting: Nurse Practitioner

## 2021-10-18 ENCOUNTER — Encounter: Payer: Self-pay | Admitting: Nurse Practitioner

## 2021-10-18 VITALS — BP 112/71 | Ht 65.0 in | Wt 150.2 lb

## 2021-10-18 DIAGNOSIS — Z3009 Encounter for other general counseling and advice on contraception: Secondary | ICD-10-CM

## 2021-10-18 DIAGNOSIS — Z113 Encounter for screening for infections with a predominantly sexual mode of transmission: Secondary | ICD-10-CM

## 2021-10-18 DIAGNOSIS — Z3042 Encounter for surveillance of injectable contraceptive: Secondary | ICD-10-CM

## 2021-10-18 DIAGNOSIS — Z01419 Encounter for gynecological examination (general) (routine) without abnormal findings: Secondary | ICD-10-CM

## 2021-10-18 DIAGNOSIS — F32A Depression, unspecified: Secondary | ICD-10-CM

## 2021-10-18 LAB — WET PREP FOR TRICH, YEAST, CLUE
Trichomonas Exam: NEGATIVE
Yeast Exam: NEGATIVE

## 2021-10-18 MED ORDER — MEDROXYPROGESTERONE ACETATE 150 MG/ML IM SUSP
150.0000 mg | INTRAMUSCULAR | Status: AC
  Administered 2021-10-18 – 2022-05-09 (×3): 150 mg via INTRAMUSCULAR

## 2021-10-18 NOTE — Progress Notes (Addendum)
[facility] DEPARTMENT [facility] [address] Main Number: [phone]    Family Planning Visit- Initial Visit  Subjective:  Karen Crane is a 41 y.o.  G1P0010   being seen today for an initial annual visit and to discuss reproductive life planning.  The patient is currently using Hormonal Injection for pregnancy prevention. Patient reports   does not want a pregnancy in the next year.     report they are looking for a method that provides High efficacy at preventing pregnancy  Patient has the following medical conditions has Substance induced mood disorder (HCC); Alcohol abuse daily with DWI 06/2020; Smoker 1/2-1 ppd; and Cocaine abuse (HCC) last use 06/2020 on their problem list.  Chief Complaint  Patient presents with   Contraception    PE and Depo    Patient reports to clinic today for a physical, STD screening, and birth control.      Body mass index is 24.99 kg/m. - Patient is eligible for diabetes screening based on BMI and age >56?  yes HA1C ordered? No, patient refused   Patient reports 2  partner/s in last year. Desires STI screening?  Yes  Has patient been screened once for HCV in the past?  Yes  No results found for: "HCVAB"  Does the patient have current drug use (including MJ), have a partner with drug use, and/or has been incarcerated since last result? Yes  If yes-- Screen for HCV through [facility]   Does the patient meet criteria for HBV testing? Yes, patient refused blood work today.   Criteria:  -Household, sexual or needle sharing contact with HBV -History of drug use -HIV positive -Those with known Hep C   Health Maintenance Due  Topic Date Due   COVID-19 Vaccine (1) Never done   TETANUS/TDAP  Never done    Review of Systems  Constitutional:  Negative for chills, fever, malaise/fatigue and weight loss.  HENT:  Negative for congestion, hearing loss and sore throat.   Eyes:  Negative for blurred  vision, double vision and photophobia.  Respiratory:  Negative for shortness of breath.   Cardiovascular:  Negative for chest pain.  Gastrointestinal:  Positive for nausea. Negative for abdominal pain, blood in stool, constipation, diarrhea, heartburn and vomiting.  Genitourinary:  Positive for dysuria. Negative for frequency.       Discharge   Musculoskeletal:  Negative for back pain, joint pain and neck pain.       Calf pain with walking   Skin:  Negative for itching and rash.  Neurological:  Negative for dizziness, weakness and headaches.       Hand numbness  Endo/Heme/Allergies:  Does not bruise/bleed easily.  Psychiatric/Behavioral:  Negative for depression, substance abuse and suicidal ideas.     The following portions of the patient's history were reviewed and updated as appropriate: allergies, current medications, past family history, past medical history, past social history, past surgical history and problem list. Problem list updated.   See flowsheet for other program required questions.  Objective:   Vitals:   10/18/21 1319  BP: 112/71  Weight: 150 lb 3.2 oz (68.1 kg)  Height: 5\' 5"  (1.651 m)    Physical Exam Constitutional:      Appearance: Normal appearance.  HENT:     Head: Normocephalic. No abrasion, masses or laceration. Hair is normal.     Jaw: No tenderness or swelling.     Right Ear: External ear normal.  Left Ear: External ear normal.     Nose: Nose normal.     Mouth/Throat:     Lips: Pink. No lesions.     Mouth: Mucous membranes are moist. No lacerations or oral lesions.     Dentition: No dental caries.     Tongue: No lesions.     Palate: No mass and lesions.     Pharynx: No pharyngeal swelling, oropharyngeal exudate, posterior oropharyngeal erythema or uvula swelling.     Tonsils: No tonsillar exudate or tonsillar abscesses.  Eyes:     Pupils: Pupils are equal, round, and reactive to light.  Neck:     Thyroid: No thyroid mass, thyromegaly or  thyroid tenderness.  Cardiovascular:     Rate and Rhythm: Normal rate and regular rhythm.  Pulmonary:     Effort: Pulmonary effort is normal.     Breath sounds: Normal breath sounds.  Chest:  Breasts:    Right: Normal. No swelling, mass, nipple discharge, skin change or tenderness.     Left: Normal. No swelling, mass, nipple discharge, skin change or tenderness.  Abdominal:     General: Abdomen is flat. Bowel sounds are normal.     Palpations: Abdomen is soft.     Tenderness: There is no abdominal tenderness. There is no rebound.  Genitourinary:    Pubic Area: No rash or pubic lice.      Labia:        Right: No rash, tenderness or lesion.        Left: No rash, tenderness or lesion.      Vagina: Normal. No vaginal discharge, erythema, tenderness or lesions.     Cervix: No cervical motion tenderness, discharge, lesion or erythema.     Uterus: Normal.      Adnexa:        Right: No tenderness.         Left: No tenderness.       Rectum: Normal.     Comments: Amount Discharge: small  Odor: No pH: less than 4.5 Adheres to vaginal wall: No Color: color of discharge matches the Sonia Stickels swab Musculoskeletal:     Cervical back: Full passive range of motion without pain and normal range of motion.  Lymphadenopathy:     Cervical: No cervical adenopathy.     Right cervical: No superficial, deep or posterior cervical adenopathy.    Left cervical: No superficial, deep or posterior cervical adenopathy.     Upper Body:     Right upper body: No supraclavicular, axillary or epitrochlear adenopathy.     Left upper body: No supraclavicular, axillary or epitrochlear adenopathy.     Lower Body: No right inguinal adenopathy. No left inguinal adenopathy.  Skin:    General: Skin is warm and dry.     Findings: No erythema, laceration, lesion or rash.  Neurological:     Mental Status: She is alert and oriented to person, place, and time.  Psychiatric:        Attention and Perception: Attention  normal.        Mood and Affect: Mood normal.        Speech: Speech normal.        Behavior: Behavior normal. Behavior is cooperative.       Assessment and Plan:  Karen Crane is a 41 y.o. female presenting to the [facility] for an initial annual wellness/contraceptive visit  Contraception counseling: Reviewed options based on patient desire and reproductive life plan. Patient is interested  in Hormonal Injection. This was provided to the patient today.   Risks, benefits, and typical effectiveness rates were reviewed.  Questions were answered.  Written information was also given to the patient to review.    The patient will follow up in  11 weeks for surveillance.  The patient was told to call with any further questions, or with any concerns about this method of contraception.  Emphasized use of condoms 100% of the time for STI prevention.  Need for ECP was assessed. Ecp not offered due to continuous use of birth control.  1. Family planning counseling -41 year old female in clinic today for a physical, birth control, and STD screening. -ROS reviewed.  Patient with complaints of vaginal discharge, burning with urination, and genital itching that began one week ago.  Patient agreed to STD testing today.  Patient reports a history of GERD and experiences some nausea at times.  Advised patient to continue with current medication regimen, reframe from eating acidic and spicy foods, sit up at least 2 hours after eating, and reframe from wearing clothing that is tight around abdomen.  Patient reports starting a new job where she is on her feet a lot and has been experiencing some calf pain with walking.  Advised patient to purchase some support stockings and supportive shoes.  Patient also reports some numbness in her hands, patient advised to follow up with PCP.   -Patient desires to continue with Depo.   2. Well woman exam with routine gynecological exam -Normal well  woman exam.   -CBE today, next due 09/2022 -PAP 11/11/2020, next due 10/2023  3. Screening examination for venereal disease -STD testing today. -Patient accepted all screenings including oral, vaginal, CT/GC, wet prep,  and declines bloodwork for HIV/RPR.  Patient meets criteria for HepB screening? Yes. Ordered? No - refused  Patient meets criteria for HepC screening? Yes. Ordered? No - refused   Treat wet prep per standing order Discussed time line for State Lab results and that patient will be called with positive results and encouraged patient to call if she had not heard in 2 weeks.  Counseled to return or seek care for continued or worsening symptoms Recommended condom use with all sex  Patient is currently using Hormonal Contraception: Injection, Rings and Patches to prevent pregnancy.    - Chlamydia/Gonorrhea [facility] - Chlamydia/Gonorrhea [facility] - WET PREP FOR TRICH, YEAST, CLUE  4. Surveillance for Depo-Provera contraception -Patient may have Depo 150 MG IM q11-13 weeks x 1 year. - medroxyPROGESTERone (DEPO-PROVERA) injection 150 mg  5. Depression, unspecified depression type -History of depression.  PHQ-9=10.  Denies thoughts of self harm.  Declines counseling services today.     Return in about 11 weeks (around 01/03/2022) for Routine DMPA injection.  Glenna Fellows, FNP

## 2021-10-18 NOTE — Progress Notes (Signed)
Pt here for PE and Depo injection.  Wet mount results reviewed, no treatment required per SO.  Depo 150 mg given IM in Lt Deltoid, without any complications.  Pt given FP packet and reminder card to return in 11-13 weeks for next Depo.  Berdie Ogren, RN

## 2021-10-25 ENCOUNTER — Other Ambulatory Visit: Payer: Self-pay

## 2021-10-25 ENCOUNTER — Emergency Department
Admission: EM | Admit: 2021-10-25 | Discharge: 2021-10-25 | Disposition: A | Discharge status: Home / Self Care | Payer: Self-pay | Attending: Emergency Medicine | Admitting: Emergency Medicine

## 2021-10-25 DIAGNOSIS — B349 Viral infection, unspecified: Secondary | ICD-10-CM | POA: Insufficient documentation

## 2021-10-25 DIAGNOSIS — E860 Dehydration: Secondary | ICD-10-CM | POA: Insufficient documentation

## 2021-10-25 DIAGNOSIS — D72829 Elevated white blood cell count, unspecified: Secondary | ICD-10-CM | POA: Insufficient documentation

## 2021-10-25 DIAGNOSIS — J029 Acute pharyngitis, unspecified: Secondary | ICD-10-CM | POA: Insufficient documentation

## 2021-10-25 DIAGNOSIS — R824 Acetonuria: Secondary | ICD-10-CM | POA: Insufficient documentation

## 2021-10-25 DIAGNOSIS — N189 Chronic kidney disease, unspecified: Secondary | ICD-10-CM | POA: Insufficient documentation

## 2021-10-25 DIAGNOSIS — Z20822 Contact with and (suspected) exposure to covid-19: Secondary | ICD-10-CM | POA: Insufficient documentation

## 2021-10-25 LAB — CBC WITH DIFFERENTIAL/PLATELET
Abs Immature Granulocytes: 0.05 10*3/uL (ref 0.00–0.07)
Basophils Absolute: 0.1 10*3/uL (ref 0.0–0.1)
Basophils Relative: 1 %
Eosinophils Absolute: 0.3 10*3/uL (ref 0.0–0.5)
Eosinophils Relative: 3 %
HCT: 42.8 % (ref 36.0–46.0)
Hemoglobin: 14.2 g/dL (ref 12.0–15.0)
Immature Granulocytes: 0 %
Lymphocytes Relative: 7 %
Lymphs Abs: 0.9 10*3/uL (ref 0.7–4.0)
MCH: 31.2 pg (ref 26.0–34.0)
MCHC: 33.2 g/dL (ref 30.0–36.0)
MCV: 94.1 fL (ref 80.0–100.0)
Monocytes Absolute: 0.8 10*3/uL (ref 0.1–1.0)
Monocytes Relative: 6 %
Neutro Abs: 10.6 10*3/uL — ABNORMAL HIGH (ref 1.7–7.7)
Neutrophils Relative %: 83 %
Platelets: 220 10*3/uL (ref 150–400)
RBC: 4.55 MIL/uL (ref 3.87–5.11)
RDW: 12.3 % (ref 11.5–15.5)
WBC: 12.8 10*3/uL — ABNORMAL HIGH (ref 4.0–10.5)
nRBC: 0 % (ref 0.0–0.2)

## 2021-10-25 LAB — COMPREHENSIVE METABOLIC PANEL
ALT: 20 U/L (ref 0–44)
AST: 22 U/L (ref 15–41)
Albumin: 4.2 g/dL (ref 3.5–5.0)
Alkaline Phosphatase: 64 U/L (ref 38–126)
Anion gap: 11 (ref 5–15)
BUN: 12 mg/dL (ref 6–20)
CO2: 21 mmol/L — ABNORMAL LOW (ref 22–32)
Calcium: 9.4 mg/dL (ref 8.9–10.3)
Chloride: 106 mmol/L (ref 98–111)
Creatinine, Ser: 1.55 mg/dL — ABNORMAL HIGH (ref 0.44–1.00)
GFR, Estimated: 43 mL/min — ABNORMAL LOW (ref >60)
Glucose, Bld: 119 mg/dL — ABNORMAL HIGH (ref 70–99)
Potassium: 3.5 mmol/L (ref 3.5–5.1)
Sodium: 138 mmol/L (ref 135–145)
Total Bilirubin: 1 mg/dL (ref 0.3–1.2)
Total Protein: 7.9 g/dL (ref 6.5–8.1)

## 2021-10-25 LAB — URINALYSIS, ROUTINE W REFLEX MICROSCOPIC
Bacteria, UA: NONE SEEN
Bilirubin Urine: NEGATIVE
Glucose, UA: 50 mg/dL — AB
Ketones, ur: 5 mg/dL — AB
Nitrite: NEGATIVE
Protein, ur: 100 mg/dL — AB
Specific Gravity, Urine: 1.013 (ref 1.005–1.030)
Squamous Epithelial / HPF: NONE SEEN (ref 0–5)
WBC, UA: NONE SEEN WBC/hpf (ref 0–5)
pH: 6 (ref 5.0–8.0)

## 2021-10-25 LAB — POC URINE PREG, ED: Preg Test, Ur: NEGATIVE

## 2021-10-25 LAB — HIV ANTIBODY (ROUTINE TESTING W REFLEX): HIV Screen 4th Generation wRfx: NONREACTIVE

## 2021-10-25 LAB — LACTIC ACID, PLASMA: Lactic Acid, Venous: 1.3 mmol/L (ref 0.5–1.9)

## 2021-10-25 LAB — GROUP A STREP BY PCR: Group A Strep by PCR: NOT DETECTED

## 2021-10-25 LAB — SARS CORONAVIRUS 2 BY RT PCR: SARS Coronavirus 2 by RT PCR: NEGATIVE

## 2021-10-25 MED ORDER — SODIUM CHLORIDE 0.9 % IV BOLUS
1000.0000 mL | Freq: Once | INTRAVENOUS | Status: AC
  Administered 2021-10-25: 1000 mL via INTRAVENOUS

## 2021-10-25 MED ORDER — ACETAMINOPHEN 500 MG PO TABS
1000.0000 mg | ORAL_TABLET | Freq: Once | ORAL | Status: AC
  Administered 2021-10-25: 1000 mg via ORAL
  Filled 2021-10-25: qty 2

## 2021-10-25 MED ORDER — CEFDINIR 300 MG PO CAPS
300.0000 mg | ORAL_CAPSULE | Freq: Two times a day (BID) | ORAL | 0 refills | Status: AC
Start: 2021-10-25 — End: 2021-11-01

## 2021-10-25 MED ORDER — SODIUM CHLORIDE 0.9 % IV SOLN
2.0000 g | Freq: Once | INTRAVENOUS | Status: AC
  Administered 2021-10-25: 2 g via INTRAVENOUS
  Filled 2021-10-25: qty 20

## 2021-10-25 MED ORDER — ONDANSETRON 4 MG PO TBDP: 4.0000 mg | ORAL_TABLET | Freq: Three times a day (TID) | ORAL | 0 refills | Status: DC | PRN

## 2021-10-25 MED ORDER — AMOXICILLIN-POT CLAVULANATE 875-125 MG PO TABS: 1.0000 | ORAL_TABLET | Freq: Two times a day (BID) | ORAL | 0 refills | Status: DC

## 2021-10-25 MED ORDER — KETOROLAC TROMETHAMINE 30 MG/ML IJ SOLN
15.0000 mg | Freq: Once | INTRAMUSCULAR | Status: AC
  Administered 2021-10-25: 15 mg via INTRAVENOUS
  Filled 2021-10-25: qty 1

## 2021-10-25 MED ORDER — IBUPROFEN 600 MG PO TABS
600.0000 mg | ORAL_TABLET | Freq: Three times a day (TID) | ORAL | 0 refills | Status: DC | PRN
Start: 2021-10-25 — End: 2023-03-07

## 2021-10-25 MED ORDER — DEXAMETHASONE SODIUM PHOSPHATE 10 MG/ML IJ SOLN
10.0000 mg | Freq: Once | INTRAMUSCULAR | Status: AC
  Administered 2021-10-25: 10 mg via INTRAVENOUS
  Filled 2021-10-25: qty 1

## 2021-10-25 MED ORDER — ONDANSETRON HCL 4 MG/2ML IJ SOLN
4.0000 mg | Freq: Once | INTRAMUSCULAR | Status: AC
Start: 2021-10-25 — End: 2021-10-25
  Administered 2021-10-25: 4 mg via INTRAVENOUS
  Filled 2021-10-25: qty 2

## 2021-10-25 MED ORDER — DROPERIDOL 2.5 MG/ML IJ SOLN
2.5000 mg | Freq: Once | INTRAMUSCULAR | Status: AC
  Administered 2021-10-25: 2.5 mg via INTRAVENOUS
  Filled 2021-10-25: qty 2

## 2021-10-25 NOTE — ED Notes (Signed)
Pt was ambulated in room  states she feels better but still has some discomfort in her hips

## 2021-10-30 LAB — CULTURE, BLOOD (ROUTINE X 2)
Culture: NO GROWTH
Culture: NO GROWTH

## 2021-11-03 ENCOUNTER — Telehealth: Payer: Self-pay | Admitting: Family Medicine

## 2021-11-03 NOTE — Telephone Encounter (Signed)
Pt notified of negative GC/Chlamydia results.

## 2022-01-06 ENCOUNTER — Ambulatory Visit (LOCAL_COMMUNITY_HEALTH_CENTER): Payer: Self-pay | Admitting: Nurse Practitioner

## 2022-01-06 ENCOUNTER — Encounter: Payer: Self-pay | Admitting: Nurse Practitioner

## 2022-01-06 VITALS — BP 113/81 | Ht 65.0 in | Wt 135.0 lb

## 2022-01-06 DIAGNOSIS — N898 Other specified noninflammatory disorders of vagina: Secondary | ICD-10-CM

## 2022-01-06 DIAGNOSIS — B9689 Other specified bacterial agents as the cause of diseases classified elsewhere: Secondary | ICD-10-CM

## 2022-01-06 DIAGNOSIS — Z3009 Encounter for other general counseling and advice on contraception: Secondary | ICD-10-CM

## 2022-01-06 DIAGNOSIS — F32A Depression, unspecified: Secondary | ICD-10-CM

## 2022-01-06 DIAGNOSIS — Z113 Encounter for screening for infections with a predominantly sexual mode of transmission: Secondary | ICD-10-CM

## 2022-01-06 DIAGNOSIS — N760 Acute vaginitis: Secondary | ICD-10-CM

## 2022-01-06 DIAGNOSIS — Z3042 Encounter for surveillance of injectable contraceptive: Secondary | ICD-10-CM

## 2022-01-06 LAB — WET PREP FOR TRICH, YEAST, CLUE
Trichomonas Exam: NEGATIVE
Yeast Exam: NEGATIVE

## 2022-01-06 MED ORDER — METRONIDAZOLE 500 MG PO TABS: 500.0000 mg | ORAL_TABLET | Freq: Two times a day (BID) | ORAL | 0 refills | Status: DC

## 2022-01-06 MED ORDER — CLOTRIMAZOLE 3 2 % VA CREA: 1.0000 | TOPICAL_CREAM | Freq: Every day | VAGINAL | 0 refills | Status: DC

## 2022-01-06 NOTE — Progress Notes (Unsigned)
Pt is here for STD screening and Depo.  Wet mount results reviewed.  Condoms declined.  The patient was dispensed Clotrimazole 1% vaginal cream and Metronidazole 500 mg #14 today. I provided counseling today regarding the medication. We discussed the medication, the side effects and when to call clinic. Patient given the opportunity to ask questions. Questions answered.   Berdie Ogren, RN

## 2022-01-06 NOTE — Progress Notes (Addendum)
[facility]  [facility]  Subjective:  Karen Crane is a 41 y.o. being seen today for an STD screening and Depo.   Chief Complaint  Patient presents with   Contraception    Depo shot and STI Screening-patient complained of vaginal itching burning and has strong odor    HPI   Does the patient have a current or past history of drug use? Yes   No components found for: "HCV"]   Health Maintenance Due  Topic Date Due   COVID-19 Vaccine (1) Never done   TETANUS/TDAP  Never done   INFLUENZA VACCINE  11/29/2021    Review of Systems  Constitutional:  Negative for chills, fever, malaise/fatigue and weight loss.  HENT:  Negative for congestion, hearing loss and sore throat.   Eyes:  Negative for blurred vision, double vision and photophobia.  Respiratory:  Negative for shortness of breath.   Cardiovascular:  Negative for chest pain.  Gastrointestinal:  Negative for abdominal pain, blood in stool, constipation, diarrhea, heartburn, nausea and vomiting.  Genitourinary:  Negative for dysuria and frequency.       Discharge, odor, burning, irritation, and itching   Musculoskeletal:  Negative for back pain, joint pain and neck pain.  Skin:  Negative for itching and rash.  Neurological:  Negative for dizziness, weakness and headaches.  Endo/Heme/Allergies:  Does not bruise/bleed easily.  Psychiatric/Behavioral:  Negative for depression, substance abuse and suicidal ideas.     The following portions of the patient's history were reviewed and updated as appropriate: allergies, current medications, past family history, past medical history, past social history, past surgical history and problem list. Problem list updated.   See flowsheet for other program required questions.  Objective:   Vitals:   01/06/22 1502  BP: 113/81  Weight: 135 lb (61.2 kg)  Height: 5\' 5"  (1.651 m)    Physical Exam Constitutional:      Appearance:  Normal appearance.  HENT:     Head: Normocephalic. No abrasion, masses or laceration. Hair is normal.     Right Ear: External ear normal.     Left Ear: External ear normal.     Nose: Nose normal.     Mouth/Throat:     Lips: Pink.     Mouth: Mucous membranes are moist. No oral lesions.     Pharynx: No oropharyngeal exudate or posterior oropharyngeal erythema.     Tonsils: No tonsillar exudate or tonsillar abscesses.     Comments: No visible signs of dental caries  Eyes:     General: Lids are normal.        Right eye: No discharge.        Left eye: No discharge.     Conjunctiva/sclera: Conjunctivae normal.     Right eye: No exudate.    Left eye: No exudate. Cardiovascular:     Rate and Rhythm: Normal rate and regular rhythm.  Pulmonary:     Effort: Pulmonary effort is normal.     Breath sounds: Normal breath sounds.  Abdominal:     General: Abdomen is flat.     Palpations: Abdomen is soft.     Tenderness: There is no abdominal tenderness. There is no rebound.  Genitourinary:    Pubic Area: No rash or pubic lice.      Labia:        Right: No rash, tenderness, lesion or injury.        Left: No rash, tenderness, lesion or  injury.      Vagina: Normal. No vaginal discharge, erythema or lesions.     Cervix: No cervical motion tenderness, discharge, lesion or erythema.     Uterus: Not enlarged and not tender.      Rectum: Normal.     Comments: Amount Discharge: moderate Odor: Yes pH: greater than 4.5 Adheres to vaginal wall: No Color: yellowish   Musculoskeletal:     Cervical back: Full passive range of motion without pain, normal range of motion and neck supple.  Lymphadenopathy:     Cervical: No cervical adenopathy.     Right cervical: No superficial, deep or posterior cervical adenopathy.    Left cervical: No superficial, deep or posterior cervical adenopathy.     Upper Body:     Right upper body: No supraclavicular, axillary or epitrochlear adenopathy.     Left upper  body: No supraclavicular, axillary or epitrochlear adenopathy.     Lower Body: No right inguinal adenopathy. No left inguinal adenopathy.  Skin:    General: Skin is warm and dry.     Findings: No lesion or rash.  Neurological:     Mental Status: She is alert and oriented to person, place, and time.  Psychiatric:        Attention and Perception: Attention normal.        Mood and Affect: Mood normal.        Speech: Speech normal.        Behavior: Behavior normal. Behavior is cooperative.       Assessment and Plan:  Karen Crane is a 41 y.o. female presenting to the [facility] Department for a Women's Health problem visit  1. Family planning counseling -41 year old female in clinic today for her Depo and STD screening.  2. Surveillance for Depo-Provera contraception -May have Depo per 10/18/21 odor.   3. Screening for venereal disease -STD screening. -Patient accepted all screenings including oral, vaginal CT/GC, wet prep and declines bloodwork for HIV/RPR.  Patient meets criteria for HepB screening? Yes. Ordered? No - refused Patient meets criteria for HepC screening? Yes. Ordered? No - refused   Treat wet prep per standing order Discussed time line for State Lab results and that patient will be called with positive results and encouraged patient to call if she had not heard in 2 weeks.  Counseled to return or seek care for continued or worsening symptoms Recommended condom use with all sex  Patient is currently using Hormonal Contraception: Injection, Rings and Patches to prevent pregnancy.    - Chlamydia/Gonorrhea [facility] - Chlamydia/Gonorrhea [facility] - WET PREP FOR TRICH, YEAST, CLUE   4. BV (bacterial vaginosis) -Wet prep reviewed, please treat patient for BV. - metroNIDAZOLE (FLAGYL) 500 MG tablet; Take 1 tablet (500 mg total) by mouth 2 (two) times daily.  Dispense: 14 tablet; Refill: 0  5. Vaginal irritation -Due to vaginal irration,  please give patient Clotrimazole.  - clotrimazole (CLOTRIMAZOLE 3) 2 % vaginal cream; Place 1 Applicatorful vaginally at bedtime.  Dispense: 21 g; Refill: 0    6. Depression, unspecified depression type -Patient with a history of depression. Behavioral Health Referral submitted.  - Ambulatory referral to Behavioral Health    Total time spent: 30 minutes   Return in about 11 weeks (around 03/24/2022) for Routine DMPA injection.   Glenna Fellows, FNP

## 2022-01-18 ENCOUNTER — Ambulatory Visit: Payer: Self-pay | Admitting: Licensed Clinical Social Worker

## 2022-02-23 ENCOUNTER — Emergency Department
Admission: EM | Admit: 2022-02-23 | Discharge: 2022-02-23 | Disposition: A | Discharge status: Home / Self Care | Payer: Self-pay | Attending: Emergency Medicine | Admitting: Emergency Medicine

## 2022-02-23 ENCOUNTER — Other Ambulatory Visit: Payer: Self-pay

## 2022-02-23 DIAGNOSIS — E860 Dehydration: Secondary | ICD-10-CM | POA: Insufficient documentation

## 2022-02-23 DIAGNOSIS — R944 Abnormal results of kidney function studies: Secondary | ICD-10-CM | POA: Insufficient documentation

## 2022-02-23 DIAGNOSIS — R55 Syncope and collapse: Secondary | ICD-10-CM | POA: Insufficient documentation

## 2022-02-23 DIAGNOSIS — N390 Urinary tract infection, site not specified: Secondary | ICD-10-CM | POA: Insufficient documentation

## 2022-02-23 LAB — BASIC METABOLIC PANEL
Anion gap: 9 (ref 5–15)
BUN: 26 mg/dL — ABNORMAL HIGH (ref 6–20)
CO2: 22 mmol/L (ref 22–32)
Calcium: 9.3 mg/dL (ref 8.9–10.3)
Chloride: 107 mmol/L (ref 98–111)
Creatinine, Ser: 1.79 mg/dL — ABNORMAL HIGH (ref 0.44–1.00)
GFR, Estimated: 36 mL/min — ABNORMAL LOW (ref >60)
Glucose, Bld: 182 mg/dL — ABNORMAL HIGH (ref 70–99)
Potassium: 4.2 mmol/L (ref 3.5–5.1)
Sodium: 138 mmol/L (ref 135–145)

## 2022-02-23 LAB — CBC
HCT: 39.6 % (ref 36.0–46.0)
Hemoglobin: 13.1 g/dL (ref 12.0–15.0)
MCH: 31 pg (ref 26.0–34.0)
MCHC: 33.1 g/dL (ref 30.0–36.0)
MCV: 93.8 fL (ref 80.0–100.0)
Platelets: 280 10*3/uL (ref 150–400)
RBC: 4.22 MIL/uL (ref 3.87–5.11)
RDW: 12.4 % (ref 11.5–15.5)
WBC: 7.8 10*3/uL (ref 4.0–10.5)
nRBC: 0 % (ref 0.0–0.2)

## 2022-02-23 LAB — TROPONIN I (HIGH SENSITIVITY): Troponin I (High Sensitivity): 8 ng/L (ref <18)

## 2022-02-23 MED ORDER — SODIUM CHLORIDE 0.9 % IV BOLUS
500.0000 mL | Freq: Once | INTRAVENOUS | Status: AC
  Administered 2022-02-23: 500 mL via INTRAVENOUS

## 2022-02-23 MED ORDER — CEPHALEXIN 500 MG PO CAPS: 500.0000 mg | ORAL_CAPSULE | Freq: Two times a day (BID) | ORAL | 0 refills | Status: DC

## 2022-02-23 NOTE — ED Triage Notes (Signed)
"  Been on antibiotics for kidney infection, now my sinus is acting up now with headache, sweating, dizziness and not feeling good. Then today I got dizzy at work and felt like I could pass out. Did not pass out,but I was sweating" per pt.

## 2022-02-23 NOTE — ED Provider Notes (Signed)
[facility] Provider Note    Event Date/Time   First MD Initiated Contact with Patient 02/23/22 0902     (approximate)   History   Near Syncope   HPI  Karen Crane is a 41 y.o. female who presents after a near syncopal episode.  Patient reports that she frequently has dizzy episodes but she was at work today and they called EMS against her wishes.  She states typically she feels better quickly.  Today she notes that she has not had as much to drink this morning, has had symptoms of UTI, started taking Augmentin that she had leftover recently.  No fevers, no back pain.     Physical Exam   Triage Vital Signs: ED Triage Vitals  Enc Vitals Group     BP 02/23/22 0908 139/88     Pulse Rate 02/23/22 0908 85     Resp 02/23/22 0908 16     Temp 02/23/22 0907 97.8 F (36.6 C)     Temp Source 02/23/22 0907 Oral     SpO2 02/23/22 0930 100 %     Weight 02/23/22 0908 63.5 kg (140 lb)     Height 02/23/22 0908 1.651 m (5\' 5" )     Head Circumference --      Peak Flow --      Pain Score 02/23/22 0907 2     Pain Loc --      Pain Edu? --      Excl. in [place]? --     Most recent vital signs: Vitals:   02/23/22 0930 02/23/22 1100  BP: 97/67 110/74  Pulse: 79 72  Resp: (!) 21 20  Temp:  98 F (36.7 C)  SpO2: 100% 98%     General: Awake, no distress.  CV:  Good peripheral perfusion.  Resp:  Normal effort.  Abd:  No distention.  No CVA tenderness Other:     ED Results / Procedures / Treatments   Labs (all labs ordered are listed, but only abnormal results are displayed) Labs Reviewed  BASIC METABOLIC PANEL - Abnormal; Notable for the following components:      Result Value   Glucose, Bld 182 (*)    BUN 26 (*)    Creatinine, Ser 1.79 (*)    GFR, Estimated 36 (*)    All other components within normal limits  CBC  TROPONIN I (HIGH SENSITIVITY)     EKG  ED ECG REPORT I, Lavonia Drafts, the attending physician, personally viewed and  interpreted this ECG.  Date: 02/23/2022  Rhythm: normal sinus rhythm QRS Axis: normal Intervals: normal ST/T Wave abnormalities: normal Narrative Interpretation: no evidence of acute ischemia    RADIOLOGY     PROCEDURES:  Critical Care performed:   Procedures   MEDICATIONS ORDERED IN ED: Medications  sodium chloride 0.9 % bolus 500 mL (0 mLs Intravenous Stopped 02/23/22 1037)     IMPRESSION / MDM / ASSESSMENT AND PLAN / ED COURSE  I reviewed the triage vital signs and the nursing notes. Patient's presentation is most consistent with acute illness / injury with system symptoms.  Patient presents after near syncopal episode as detailed above.  Overall she is well-appearing here in no acute distress, blood pressure vitals normal.  EKG is unremarkable.  Lab work is consistent with elevated BUN/creatinine ratio suggestive of dehydration, patient treated with IV fluids.  Will prescribe appropriate antibiotics for UTI.  Patient followed recommended, return precautions discussed      FINAL  CLINICAL IMPRESSION(S) / ED DIAGNOSES   Final diagnoses:  Near syncope  Dehydration  Lower urinary tract infectious disease     Rx / DC Orders   ED Discharge Orders          Ordered    cephALEXin (KEFLEX) 500 MG capsule  2 times daily        02/23/22 0956             Note:  This document was prepared using Dragon voice recognition software and may include unintentional dictation errors.   Jene Every, MD 02/23/22 [phone]

## 2022-03-28 ENCOUNTER — Ambulatory Visit: Payer: Self-pay | Admitting: Family Medicine

## 2022-05-09 ENCOUNTER — Encounter: Payer: Self-pay | Admitting: Nurse Practitioner

## 2022-05-09 ENCOUNTER — Ambulatory Visit (LOCAL_COMMUNITY_HEALTH_CENTER): Payer: Self-pay | Admitting: Nurse Practitioner

## 2022-05-09 VITALS — BP 121/82 | Ht 65.0 in | Wt 139.6 lb

## 2022-05-09 DIAGNOSIS — Z113 Encounter for screening for infections with a predominantly sexual mode of transmission: Secondary | ICD-10-CM

## 2022-05-09 DIAGNOSIS — Z3042 Encounter for surveillance of injectable contraceptive: Secondary | ICD-10-CM

## 2022-05-09 DIAGNOSIS — Z309 Encounter for contraceptive management, unspecified: Secondary | ICD-10-CM

## 2022-05-09 NOTE — Progress Notes (Signed)
[facility]  [facility]  Subjective:  Karen Crane is a 42 y.o. being seen today for STD screening and to receive her Depo.  Last Depo was 01/06/2022 ([redacted]w[redacted]d).   Patient also reports some vaginal itching and discharge that began one week ago.   Last sexual encounter was a couple months ago.   Chief Complaint  Patient presents with   Acute Visit    STI screening-discharge, itching.  Wants to get Depo shot    HPI   Does the patient have a current or past history of drug use? Yes    Health Maintenance Due  Topic Date Due   COVID-19 Vaccine (1) Never done   DTaP/Tdap/Td (1 - Tdap) Never done   INFLUENZA VACCINE  11/29/2021    Review of Systems  Constitutional:  Negative for chills, fever, malaise/fatigue and weight loss.  HENT:  Negative for congestion, hearing loss and sore throat.   Eyes:  Negative for blurred vision, double vision and photophobia.  Respiratory:  Negative for shortness of breath.   Cardiovascular:  Negative for chest pain.  Gastrointestinal:  Negative for abdominal pain, blood in stool, constipation, diarrhea, heartburn, nausea and vomiting.  Genitourinary:  Negative for dysuria and frequency.       Vaginal discharge  Itching   Musculoskeletal:  Negative for back pain, joint pain and neck pain.  Skin:  Negative for itching and rash.  Neurological:  Negative for dizziness, weakness and headaches.  Endo/Heme/Allergies:  Does not bruise/bleed easily.  Psychiatric/Behavioral:  Negative for depression, substance abuse and suicidal ideas.     The following portions of the patient's history were reviewed and updated as appropriate: allergies, current medications, past family history, past medical history, past social history, past surgical history and problem list. Problem list updated.   See flowsheet for other program required questions.  Objective:   Vitals:   05/09/22 1528  BP: 121/82  Weight: 139 lb  9.6 oz (63.3 kg)  Height: 5\' 5"  (1.651 m)    Physical Exam Constitutional:      Appearance: Normal appearance.  HENT:     Head: Normocephalic. No abrasion, masses or laceration. Hair is normal.     Jaw: No tenderness or swelling.     Right Ear: External ear normal.     Left Ear: External ear normal.     Nose: Nose normal.     Mouth/Throat:     Lips: Pink. No lesions.     Mouth: Mucous membranes are moist. No lacerations or oral lesions.     Dentition: No dental caries.     Tongue: No lesions.     Palate: No mass and lesions.     Pharynx: No pharyngeal swelling, oropharyngeal exudate, posterior oropharyngeal erythema or uvula swelling.     Tonsils: No tonsillar exudate or tonsillar abscesses.  Neck:     Thyroid: No thyroid mass, thyromegaly or thyroid tenderness.  Cardiovascular:     Rate and Rhythm: Normal rate and regular rhythm.  Pulmonary:     Effort: Pulmonary effort is normal.     Breath sounds: Normal breath sounds.  Abdominal:     General: Abdomen is flat. Bowel sounds are normal.     Palpations: Abdomen is soft.     Tenderness: There is no abdominal tenderness. There is no rebound.  Genitourinary:    Pubic Area: No rash or pubic lice.      Labia:  Right: No rash, tenderness or lesion.        Left: No rash, tenderness or lesion.      Vagina: Normal. No vaginal discharge, erythema, tenderness or lesions.     Cervix: No cervical motion tenderness, discharge, lesion or erythema.     Uterus: Normal.      Adnexa:        Right: No tenderness.         Left: No tenderness.       Rectum: Normal.     Comments: Amount Discharge: small  Odor: No pH: less than 4.5 Adheres to vaginal wall: No Color: Ancel Easler Musculoskeletal:     Cervical back: Full passive range of motion without pain and normal range of motion.  Lymphadenopathy:     Cervical: No cervical adenopathy.     Right cervical: No superficial, deep or posterior cervical adenopathy.    Left cervical: No  superficial, deep or posterior cervical adenopathy.     Upper Body:     Right upper body: No supraclavicular, axillary or epitrochlear adenopathy.     Left upper body: No supraclavicular, axillary or epitrochlear adenopathy.     Lower Body: No right inguinal adenopathy. No left inguinal adenopathy.  Skin:    General: Skin is warm and dry.     Findings: No erythema, laceration, lesion or rash.  Neurological:     Mental Status: She is alert and oriented to person, place, and time.  Psychiatric:        Attention and Perception: Attention normal.        Mood and Affect: Mood normal.        Speech: Speech normal.        Behavior: Behavior normal. Behavior is cooperative.       Assessment and Plan:  Karen Crane is a 43 y.o. female presenting to the [facility] Department for a Women's Health problem visit   1. Screening examination for venereal disease -42 year old female in clinic for STD screening. -Patient accepted all screenings including oral, vaginal CT/GC, wet prep and bloodwork for HIV/RPR.  Patient meets criteria for HepB screening? Yes. Ordered? No - low risk  Patient meets criteria for HepC screening? Yes. Ordered? No - low risk   Treat wet prep per standing order Discussed time line for State Lab results and that patient will be called with positive results and encouraged patient to call if she had not heard in 2 weeks.  Counseled to return or seek care for continued or worsening symptoms Recommended condom use with all sex  Patient is currently using Hormonal Contraception: Injection, Rings and Patches to prevent pregnancy.    - [place], YEAST, CLUE  2. Surveillance for Depo-Provera contraception -Patient may have Depo per 09/2021 order Gregary Cromer, FNP.  RP due 09/2022.    Total time spent: 30 minutes   Return in about 11 weeks (around 07/25/2022) for Routine DMPA  injection.    Gregary Cromer, FNP

## 2022-05-09 NOTE — Progress Notes (Signed)
Pt is here for STD screening and a Depo injection.  Wet mount results reviewed, no treatment required.  Depo given IM in RT deltoid.  Pt tolerated well.  Pt given reminder card to return in 11-13 weeks for next Depo injection.  Windle Guard, RN

## 2022-05-10 LAB — WET PREP FOR TRICH, YEAST, CLUE
Trichomonas Exam: NEGATIVE
Yeast Exam: NEGATIVE

## 2022-06-30 ENCOUNTER — Emergency Department
Admission: EM | Admit: 2022-06-30 | Discharge: 2022-06-30 | Disposition: A | Discharge status: Home / Self Care | Payer: 59 | Attending: Emergency Medicine | Admitting: Emergency Medicine

## 2022-06-30 ENCOUNTER — Other Ambulatory Visit: Payer: Self-pay

## 2022-06-30 ENCOUNTER — Encounter: Payer: Self-pay | Admitting: Radiology

## 2022-06-30 DIAGNOSIS — R300 Dysuria: Secondary | ICD-10-CM | POA: Diagnosis not present

## 2022-06-30 DIAGNOSIS — N390 Urinary tract infection, site not specified: Secondary | ICD-10-CM | POA: Diagnosis not present

## 2022-06-30 LAB — URINALYSIS, ROUTINE W REFLEX MICROSCOPIC
Bilirubin Urine: NEGATIVE
Glucose, UA: NEGATIVE mg/dL
Hgb urine dipstick: NEGATIVE
Ketones, ur: NEGATIVE mg/dL
Nitrite: NEGATIVE
Protein, ur: NEGATIVE mg/dL
Specific Gravity, Urine: 1.015 (ref 1.005–1.030)
WBC, UA: >50 WBC/hpf (ref 0–5)
pH: 5 (ref 5.0–8.0)

## 2022-06-30 LAB — CBC
HCT: 42.6 % (ref 36.0–46.0)
Hemoglobin: 14.1 g/dL (ref 12.0–15.0)
MCH: 31.4 pg (ref 26.0–34.0)
MCHC: 33.1 g/dL (ref 30.0–36.0)
MCV: 94.9 fL (ref 80.0–100.0)
Platelets: 325 10*3/uL (ref 150–400)
RBC: 4.49 MIL/uL (ref 3.87–5.11)
RDW: 12.2 % (ref 11.5–15.5)
WBC: 7.2 10*3/uL (ref 4.0–10.5)
nRBC: 0 % (ref 0.0–0.2)

## 2022-06-30 LAB — BASIC METABOLIC PANEL
Anion gap: 11 (ref 5–15)
BUN: 32 mg/dL — ABNORMAL HIGH (ref 6–20)
CO2: 24 mmol/L (ref 22–32)
Calcium: 9.4 mg/dL (ref 8.9–10.3)
Chloride: 102 mmol/L (ref 98–111)
Creatinine, Ser: 1.6 mg/dL — ABNORMAL HIGH (ref 0.44–1.00)
GFR, Estimated: 41 mL/min — ABNORMAL LOW (ref >60)
Glucose, Bld: 133 mg/dL — ABNORMAL HIGH (ref 70–99)
Potassium: 3.9 mmol/L (ref 3.5–5.1)
Sodium: 137 mmol/L (ref 135–145)

## 2022-06-30 LAB — PREGNANCY, URINE: Preg Test, Ur: NEGATIVE

## 2022-06-30 MED ORDER — SODIUM CHLORIDE 0.9 % IV SOLN
1.0000 g | Freq: Once | INTRAVENOUS | Status: AC
  Administered 2022-06-30: 1 g via INTRAVENOUS
  Filled 2022-06-30: qty 10

## 2022-06-30 MED ORDER — CEPHALEXIN 500 MG PO CAPS: 500.0000 mg | ORAL_CAPSULE | Freq: Four times a day (QID) | ORAL | 0 refills | Status: AC

## 2022-06-30 MED ORDER — NITROFURANTOIN MONOHYD MACRO 100 MG PO CAPS
100.0000 mg | ORAL_CAPSULE | Freq: Two times a day (BID) | ORAL | 0 refills | Status: AC
Start: 2022-06-30 — End: 2022-07-07

## 2022-06-30 NOTE — Discharge Instructions (Signed)
Please seek medical attention for any high fevers, chest pain, shortness of breath, change in behavior, persistent vomiting, bloody stool or any other new or concerning symptoms.  

## 2022-06-30 NOTE — ED Triage Notes (Signed)
Pt states her kidneys hurt. She is having difficulty urinating as well as pain when she urinates. Pt states she has a constant pain in her left side as well. Pt endorses fever, nausea, and vomiting.

## 2022-06-30 NOTE — ED Provider Notes (Signed)
[facility] Provider Note    Event Date/Time   First MD Initiated Contact with Patient 06/30/22 2111     (approximate)   History   Dysuria   HPI  Karen Crane is a 42 y.o. female who presents to the emergency department today because of concerns for possible urinary tract infection and kidney infection.  Patient states that she has a history of recurrent UTIs.  This current episode started about 2 weeks ago when she started noticing some dysuria.  However it got worse over the past couple of days.  She is having pain to bilateral flanks and back.  She says that she also had fevers although has not had any for the past couple of days.     Physical Exam   Triage Vital Signs: ED Triage Vitals [06/30/22 2046]  Enc Vitals Group     BP (!) 150/90     Pulse Rate (!) 101     Resp 19     Temp (!) 97.4 F (36.3 C)     Temp Source Oral     SpO2 99 %     Weight 135 lb (61.2 kg)     Height '5\' 3"'$  (1.6 m)     Head Circumference      Peak Flow      Pain Score      Pain Loc      Pain Edu?      Excl. in [place]?     Most recent vital signs: Vitals:   06/30/22 2046  BP: (!) 150/90  Pulse: (!) 101  Resp: 19  Temp: (!) 97.4 F (36.3 C)  SpO2: 99%   General: Awake, alert, oriented. CV:  Good peripheral perfusion. Regular rate and rhythm. Resp:  Normal effort. Lungs clear. Abd:  No distention. Mild left sided cva tenderness.    ED Results / Procedures / Treatments   Labs (all labs ordered are listed, but only abnormal results are displayed) Labs Reviewed  URINALYSIS, ROUTINE W REFLEX MICROSCOPIC - Abnormal; Notable for the following components:      Result Value   Color, Urine YELLOW (*)    APPearance HAZY (*)    Leukocytes,Ua LARGE (*)    Bacteria, UA RARE (*)    All other components within normal limits  BASIC METABOLIC PANEL - Abnormal; Notable for the following components:   Glucose, Bld 133 (*)    BUN 32 (*)    Creatinine, Ser 1.60 (*)     GFR, Estimated 41 (*)    All other components within normal limits  URINE CULTURE  CBC  PREGNANCY, URINE     EKG  None   RADIOLOGY None   PROCEDURES:  Critical Care performed: No  Procedures    MEDICATIONS ORDERED IN ED: Medications - No data to display   IMPRESSION / MDM / [place] / ED COURSE  I reviewed the triage vital signs and the nursing notes.                              Differential diagnosis includes, but is not limited to, UTI, pyelonephritis, kidney stone  Patient's presentation is most consistent with acute presentation with potential threat to life or bodily function.  Patient presented to the emergency department today because of concerns for dysuria and possible kidney infection.  Patient does have some mild left CVA tenderness.  Blood work without concerning leukocytosis patient  is afebrile here.  Urine however is consistent with infection.  Did discuss this with the patient.  Will give patient dose of IV antibiotics here.  Do think it is reasonable to trial patient on oral antibiotics.  Initially prescribed Keflex however patient requested changed to [place].  She states that this works better for her.      FINAL CLINICAL IMPRESSION(S) / ED DIAGNOSES   Final diagnoses:  Lower urinary tract infectious disease    Note:  This document was prepared using Dragon voice recognition software and may include unintentional dictation errors.     Nance Pear, MD 06/30/22 2252

## 2022-07-02 LAB — URINE CULTURE: Culture: NO GROWTH

## 2022-07-13 ENCOUNTER — Encounter: Payer: Self-pay | Admitting: Family Medicine

## 2022-07-13 ENCOUNTER — Ambulatory Visit: Payer: 59 | Admitting: Family Medicine

## 2022-07-13 DIAGNOSIS — B9689 Other specified bacterial agents as the cause of diseases classified elsewhere: Secondary | ICD-10-CM

## 2022-07-13 DIAGNOSIS — Z113 Encounter for screening for infections with a predominantly sexual mode of transmission: Secondary | ICD-10-CM

## 2022-07-13 DIAGNOSIS — N760 Acute vaginitis: Secondary | ICD-10-CM

## 2022-07-13 LAB — WET PREP FOR TRICH, YEAST, CLUE
Trichomonas Exam: NEGATIVE
Yeast Exam: NEGATIVE

## 2022-07-13 MED ORDER — METRONIDAZOLE 500 MG PO TABS: 500.0000 mg | ORAL_TABLET | Freq: Two times a day (BID) | ORAL | 0 refills | Status: AC

## 2022-07-13 NOTE — Progress Notes (Signed)
[facility] Department  STI clinic/screening visit [place] [address] [phone]  Subjective:  Karen Crane is a 42 y.o. female being seen today for an STI screening visit. The patient reports they do have symptoms.  Patient reports that they do not desire a pregnancy in the next year.   They reported they are not interested in discussing contraception today.    No LMP recorded. Patient has had an injection.  Patient has the following medical conditions:   Patient Active Problem List   Diagnosis Date Noted   Smoker 1/2-1 ppd 11/11/2020   Cocaine abuse ([place]) last use 06/2020 11/11/2020   Substance induced mood disorder ([place]) 06/18/2020   Alcohol abuse daily with DWI 06/2020 06/18/2020    Chief Complaint  Patient presents with   SEXUALLY TRANSMITTED DISEASE    Vaginal burning since Jan. Also has a yellow discharge and slight odor.    HPI  Patient reports to clinic with 3 month hx of vaginal itching and discharged.   Does the patient using douching products? Practitioner oversight- forgot to ask.  Last HIV test per patient/review of record was  Lab Results  Component Value Date   HMHIVSCREEN Negative - Validated 09/07/2020    Lab Results  Component Value Date   HIV Non Reactive 10/25/2021   Patient reports last pap was No results found for: "DIAGPAP"  Lab Results  Component Value Date   SPECADGYN Comment 11/11/2020    Screening for MPX risk: Does the patient have an unexplained rash? No Is the patient MSM? No Does the patient endorse multiple sex partners or anonymous sex partners? No Did the patient have close or sexual contact with a person diagnosed with MPX? No Has the patient traveled outside the [place] where MPX is endemic? No Is there a high clinical suspicion for MPX-- evidenced by one of the following No  -Unlikely to be chickenpox  -Lymphadenopathy  -Rash that present in same phase of evolution on any given body  part See flowsheet for further details and programmatic requirements.   Immunization history:  Immunization History  Administered Date(s) Administered   Hepatitis B 07/19/1995, 08/17/1995   PPD Test 02/25/2021     The following portions of the patient's history were reviewed and updated as appropriate: allergies, current medications, past medical history, past social history, past surgical history and problem list.  Objective:  There were no vitals filed for this visit.  Physical Exam Vitals and nursing note reviewed.  Constitutional:      Appearance: Normal appearance.  HENT:     Head: Normocephalic and atraumatic.     Mouth/Throat:     Mouth: Mucous membranes are moist.     Pharynx: Oropharynx is clear. No oropharyngeal exudate or posterior oropharyngeal erythema.  Pulmonary:     Effort: Pulmonary effort is normal.  Abdominal:     General: Abdomen is flat.     Palpations: There is no mass.     Tenderness: There is no abdominal tenderness. There is no rebound.  Genitourinary:    General: Normal vulva.     Exam position: Lithotomy position.     Pubic Area: No rash or pubic lice.      Tanner stage (genital): 5.     Labia:        Right: No rash or lesion.        Left: No rash or lesion.      Vagina: Vaginal discharge and tenderness present. No erythema,  bleeding or lesions.     Cervix: No cervical motion tenderness, discharge, friability, lesion or erythema.     Uterus: Normal.      Adnexa: Right adnexa normal and left adnexa normal.     Rectum: Normal.     Comments: pH = 5  Yellow- green discharge present  Lymphadenopathy:     Head:     Right side of head: No preauricular or posterior auricular adenopathy.     Left side of head: No preauricular or posterior auricular adenopathy.     Cervical: No cervical adenopathy.     Upper Body:     Right upper body: No supraclavicular, axillary or epitrochlear adenopathy.     Left upper body: No supraclavicular, axillary or  epitrochlear adenopathy.     Lower Body: No right inguinal adenopathy. No left inguinal adenopathy.  Skin:    General: Skin is warm and dry.     Findings: No rash.  Neurological:     Mental Status: She is alert and oriented to person, place, and time.      Assessment and Plan:  Karen Crane is a 42 y.o. female presenting to the [facility] Department for STI screening  1. Screening for venereal disease  - Chlamydia/Gonorrhea [facility] - WET PREP FOR [place], YEAST, CLUE  2. Bacterial vaginosis  - metroNIDAZOLE (FLAGYL) 500 MG tablet; Take 1 tablet (500 mg total) by mouth 2 (two) times daily for 7 days.  Dispense: 14 tablet; Refill: 0    Patient accepted all screenings including vaginal CT/GC and  and wet prep. Patient meets criteria for HepB screening? No. Ordered? not applicable Patient meets criteria for HepC screening? No. Ordered? not applicable  Treat wet prep per standing order Discussed time line for State Lab results and that patient will be called with positive results and encouraged patient to call if she had not heard in 2 weeks.  Counseled to return or seek care for continued or worsening symptoms Recommended repeat testing in 3 months with positive results. Recommended condom use with all sex  Patient is currently using  depo  to prevent pregnancy.    Return if symptoms worsen or fail to improve.  Total time spent 20 minutes  Sharlet Salina, [place]

## 2022-07-13 NOTE — Progress Notes (Signed)
Pt appointment for STI screening with symptoms. Seen by FNP Lowella Petties. Initial lab results reviewed with pt, and pt treated for BV. Metronidazole dispensed with instructions.

## 2022-09-21 ENCOUNTER — Ambulatory Visit (LOCAL_COMMUNITY_HEALTH_CENTER): Payer: 59 | Admitting: Family Medicine

## 2022-09-21 VITALS — BP 121/74 | HR 68 | Ht 65.5 in | Wt 140.8 lb

## 2022-09-21 DIAGNOSIS — Z113 Encounter for screening for infections with a predominantly sexual mode of transmission: Secondary | ICD-10-CM

## 2022-09-21 DIAGNOSIS — B9689 Other specified bacterial agents as the cause of diseases classified elsewhere: Secondary | ICD-10-CM

## 2022-09-21 DIAGNOSIS — Z3200 Encounter for pregnancy test, result unknown: Secondary | ICD-10-CM

## 2022-09-21 DIAGNOSIS — Z3009 Encounter for other general counseling and advice on contraception: Secondary | ICD-10-CM

## 2022-09-21 DIAGNOSIS — Z30013 Encounter for initial prescription of injectable contraceptive: Secondary | ICD-10-CM | POA: Diagnosis not present

## 2022-09-21 DIAGNOSIS — Z3202 Encounter for pregnancy test, result negative: Secondary | ICD-10-CM | POA: Diagnosis not present

## 2022-09-21 LAB — PREGNANCY, URINE: Preg Test, Ur: NEGATIVE

## 2022-09-21 LAB — WET PREP FOR TRICH, YEAST, CLUE
Trichomonas Exam: NEGATIVE
Yeast Exam: NEGATIVE

## 2022-09-21 MED ORDER — METRONIDAZOLE 500 MG PO TABS: 500.0000 mg | ORAL_TABLET | Freq: Two times a day (BID) | ORAL | 0 refills | Status: AC

## 2022-09-21 NOTE — Progress Notes (Signed)
Pt here for depo and sti screening. Depo given in L. [place], tolerated well. Pt notified of negative preg test. The patient was dispensed Metronidazole 500mg  lot # 14 tablets, 2 tablets per day x7 days today. I provided counseling today regarding the medication. We discussed the medication, the side effects and when to call clinic. Patient given the opportunity to ask questions. Questions answered.

## 2022-09-21 NOTE — Progress Notes (Signed)
WH Problem Visit  [facility]  Subjective:  Karen Crane is a 42 y.o. being seen today for late depo and STI testing.   Chief Complaint  Patient presents with   Contraception    HPI   Does the patient have a current or past history of drug use? Yes   No components found for: "HCV"]   Health Maintenance Due  Topic Date Due   COVID-19 Vaccine (1) Never done   DTaP/Tdap/Td (1 - Tdap) Never done    ROS  The following portions of the patient's history were reviewed and updated as appropriate: allergies, current medications, past family history, past medical history, past social history, past surgical history and problem list. Problem list updated.   See flowsheet for other program required questions.  Objective:   Vitals:   09/21/22 1004  BP: 121/74  Pulse: 68  Weight: 140 lb 12.8 oz (63.9 kg)  Height: 5' 5.5" (1.664 m)    Physical Exam Genitourinary:    Exam position: Lithotomy position.     Tanner stage (genital): 5.     Labia:        Right: No rash, tenderness or lesion.        Left: No rash, tenderness or lesion.      Vagina: Vaginal discharge present.     Cervix: Normal.     Comments: pH= 5  Small amt of yellow discharge present       Assessment and Plan:  Karen Crane is a 42 y.o. female presenting to the [facility] Department for a Women's Health problem visit  1. Encounter for pregnancy test, result unknown -PT negative today LMP 08/31/2022, last unprotected sex 09/10/22 -ok to give depo today- encouraged to take PT in 2 weeks at home  - Pregnancy, urine  2. Screening for venereal disease Declined blood work today  - WET PREP FOR TRICH, YEAST, CLUE - Chlamydia/Gonorrhea [facility]  3. BV (bacterial vaginosis)  - metroNIDAZOLE (FLAGYL) 500 MG tablet; Take 1 tablet (500 mg total) by mouth 2 (two) times daily for 7 days.  Dispense: 14 tablet; Refill: 0     Return in about  3 months (around 12/22/2022) for depo injection.  No future appointments. Total time spent 30 minutes  Lenice Llamas, [place]

## 2022-10-18 IMAGING — DX DG CHEST 1V
1 series · 1 of 1 positions shown · non-contrast
Comparison: CT chest and chest x-ray dated June 18, 2020.

CLINICAL DATA: Cough for the past 2 weeks.

EXAM:
CHEST  1 VIEW

[chest ap]
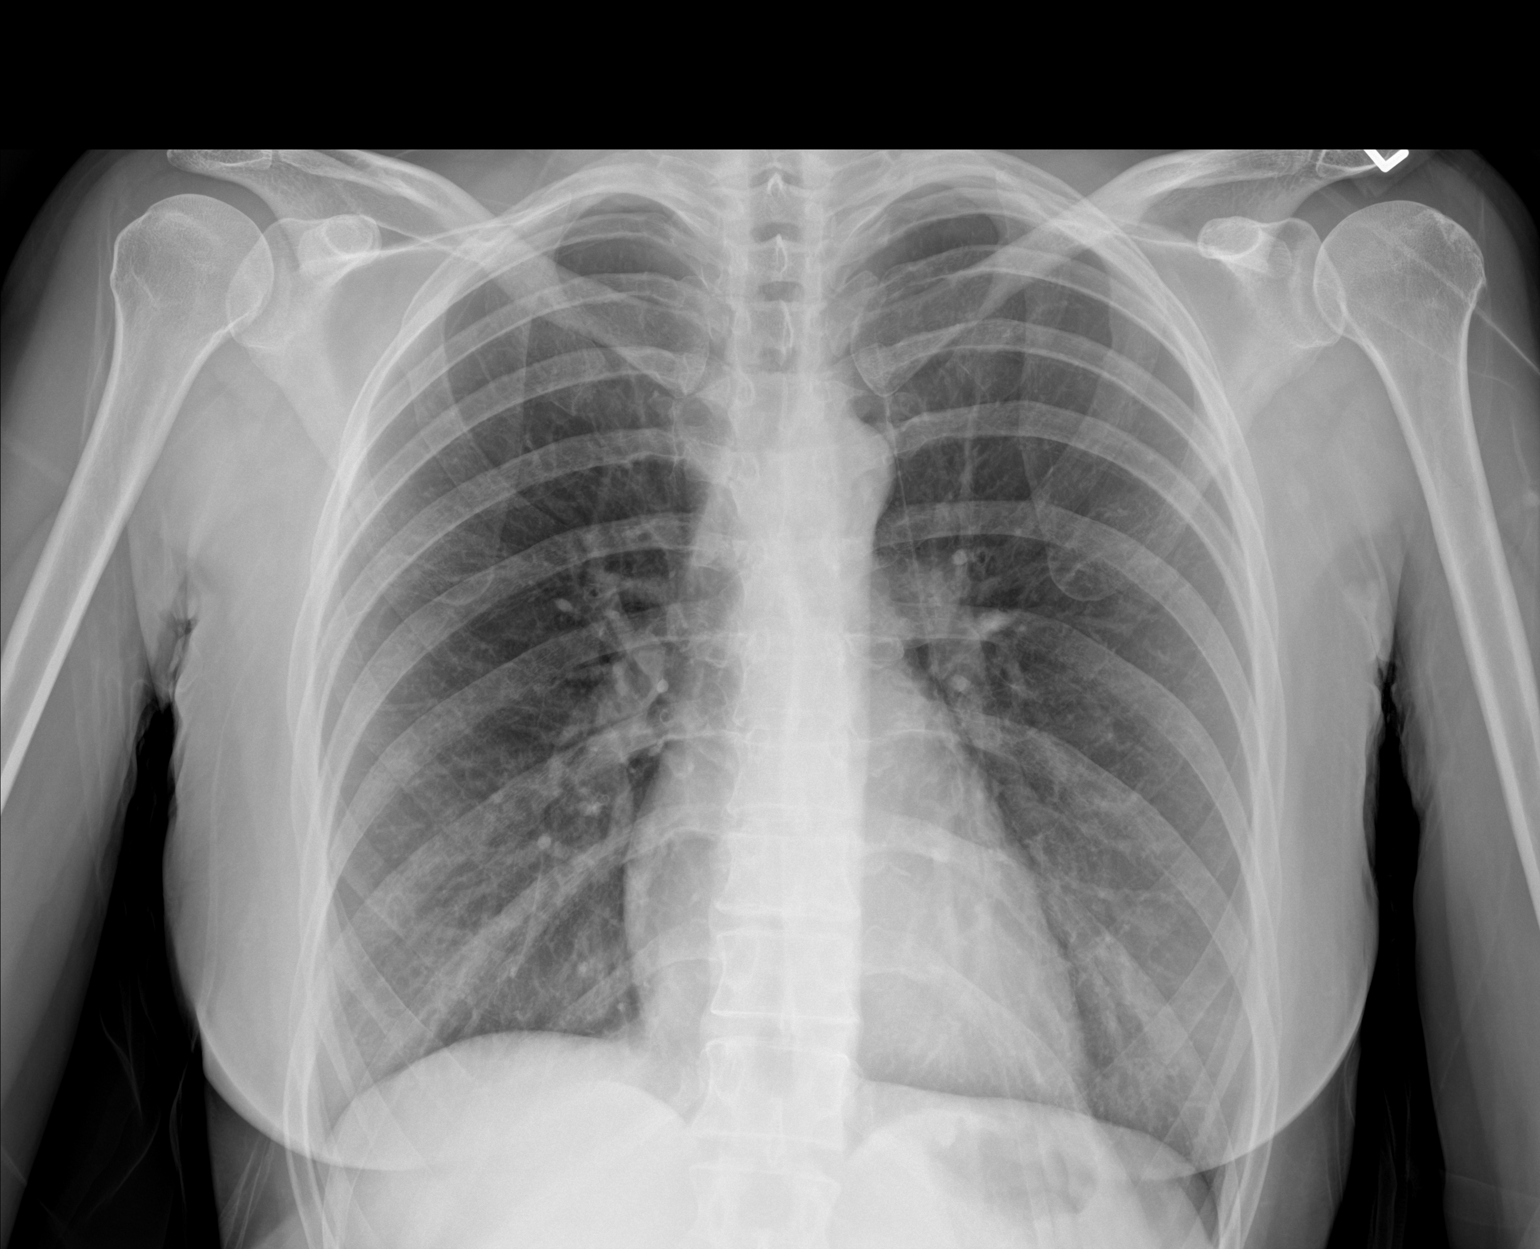

[1 of 1 positions shown; findings below may reference images not displayed]

FINDINGS: The heart size and mediastinal contours are within normal limits.
Both lungs are clear. The visualized skeletal structures are
unremarkable.
IMPRESSION: No active disease.

## 2022-12-08 ENCOUNTER — Ambulatory Visit (LOCAL_COMMUNITY_HEALTH_CENTER): Payer: 59 | Admitting: Family Medicine

## 2022-12-08 VITALS — BP 106/72 | HR 79 | Ht 65.5 in | Wt 143.0 lb

## 2022-12-08 DIAGNOSIS — Z30013 Encounter for initial prescription of injectable contraceptive: Secondary | ICD-10-CM

## 2022-12-08 DIAGNOSIS — Z113 Encounter for screening for infections with a predominantly sexual mode of transmission: Secondary | ICD-10-CM

## 2022-12-08 DIAGNOSIS — B9689 Other specified bacterial agents as the cause of diseases classified elsewhere: Secondary | ICD-10-CM

## 2022-12-08 DIAGNOSIS — Z3009 Encounter for other general counseling and advice on contraception: Secondary | ICD-10-CM | POA: Diagnosis not present

## 2022-12-08 LAB — WET PREP FOR TRICH, YEAST, CLUE
Trichomonas Exam: NEGATIVE
Yeast Exam: NEGATIVE

## 2022-12-08 MED ORDER — MEDROXYPROGESTERONE ACETATE 150 MG/ML IM SUSP
150.0000 mg | Freq: Once | INTRAMUSCULAR | Status: AC
Start: 2022-12-08 — End: 2022-12-08
  Administered 2022-12-08: 150 mg via INTRAMUSCULAR

## 2022-12-08 MED ORDER — METRONIDAZOLE 500 MG PO TABS: 500.0000 mg | ORAL_TABLET | Freq: Two times a day (BID) | ORAL | Status: AC

## 2022-12-08 NOTE — Progress Notes (Addendum)
Wet prep results reviewed with pt,  treatment required per standing order.  Condoms declined.   The patient was dispensed metronidazole today. I provided counseling today regarding the medication. We discussed the medication, the side effects and when to call clinic. Patient given the opportunity to ask questions. Questions answered.  Depo given in L. Deltoid, reminder card for next injection given. Gaspar Garbe, RN

## 2022-12-08 NOTE — Progress Notes (Signed)
[facility]  [facility]  Subjective:  Karen Crane is a 42 y.o. being seen today for   Chief Complaint  Patient presents with   Contraception    HPI   Does the patient have a current or past history of drug use? Yes   No components found for: "HCV"]   Health Maintenance Due  Topic Date Due   DTaP/Tdap/Td (1 - Tdap) Never done   COVID-19 Vaccine (1 - 2023-24 season) Never done   INFLUENZA VACCINE  11/30/2022    ROS  The following portions of the patient's history were reviewed and updated as appropriate: allergies, current medications, past family history, past medical history, past social history, past surgical history and problem list. Problem list updated.   See flowsheet for other program required questions.  Objective:   Vitals:   12/08/22 0948  BP: 106/72  Pulse: 79  Weight: 143 lb (64.9 kg)  Height: 5' 5.5" (1.664 m)    Physical Exam Vitals and nursing note reviewed.  Constitutional:      Appearance: Normal appearance.  HENT:     Head: Normocephalic and atraumatic.     Mouth/Throat:     Mouth: Mucous membranes are moist.     Pharynx: Oropharynx is clear. No oropharyngeal exudate or posterior oropharyngeal erythema.  Pulmonary:     Effort: Pulmonary effort is normal.  Abdominal:     General: Abdomen is flat.     Palpations: There is no mass.     Tenderness: There is no abdominal tenderness. There is no rebound.  Genitourinary:    General: Normal vulva.     Exam position: Lithotomy position.     Pubic Area: No rash or pubic lice.      Labia:        Right: No rash or lesion.        Left: No rash or lesion.      Vagina: Vaginal discharge present. No erythema, bleeding or lesions.     Cervix: No cervical motion tenderness, discharge, friability, lesion or erythema.     Uterus: Normal.      Adnexa: Right adnexa normal.       Left: Tenderness present.      Rectum: Normal.     Comments: pH  = 4 Lymphadenopathy:     Head:     Right side of head: No preauricular or posterior auricular adenopathy.     Left side of head: No preauricular or posterior auricular adenopathy.     Cervical: No cervical adenopathy.     Upper Body:     Right upper body: No supraclavicular, axillary or epitrochlear adenopathy.     Left upper body: No supraclavicular, axillary or epitrochlear adenopathy.     Lower Body: No right inguinal adenopathy. No left inguinal adenopathy.  Skin:    General: Skin is warm and dry.     Findings: No rash.  Neurological:     Mental Status: She is alert and oriented to person, place, and time.       Assessment and Plan:  Karen Crane is a 42 y.o. female presenting to the [facility] Department for a Women's Health problem visit  1. Screening for venereal disease -left adnexal tenderness- but no CMT, c/o "kidney pain" reports having a "kidney disease"- pt states that they have a new Dr appointment with [facility] on 8/20 ,wants to wait til then -encouraged to seek urgent care for UTI eval  if symptoms persist  - WET PREP FOR TRICH, YEAST, CLUE - Chlamydia/Gonorrhea [facility]  2. Family planning  - medroxyPROGESTERone (DEPO-PROVERA) injection 150 mg  3. BV (bacterial vaginosis)  - metroNIDAZOLE (FLAGYL) 500 MG tablet; Take 1 tablet (500 mg total) by mouth 2 (two) times daily for 7 days.     Return in about 3 months (around 03/10/2023), or if symptoms worsen or fail to improve, for STI screening, depo injection, and physical.  No future appointments.  Lenice Llamas, [place]

## 2022-12-19 DIAGNOSIS — G4700 Insomnia, unspecified: Secondary | ICD-10-CM | POA: Diagnosis not present

## 2022-12-19 DIAGNOSIS — Z1389 Encounter for screening for other disorder: Secondary | ICD-10-CM | POA: Diagnosis not present

## 2022-12-19 DIAGNOSIS — F17200 Nicotine dependence, unspecified, uncomplicated: Secondary | ICD-10-CM | POA: Diagnosis not present

## 2022-12-19 DIAGNOSIS — Z131 Encounter for screening for diabetes mellitus: Secondary | ICD-10-CM | POA: Diagnosis not present

## 2022-12-19 DIAGNOSIS — Z1159 Encounter for screening for other viral diseases: Secondary | ICD-10-CM | POA: Diagnosis not present

## 2022-12-19 DIAGNOSIS — R300 Dysuria: Secondary | ICD-10-CM | POA: Diagnosis not present

## 2022-12-19 DIAGNOSIS — Z7189 Other specified counseling: Secondary | ICD-10-CM | POA: Diagnosis not present

## 2022-12-19 DIAGNOSIS — Z1331 Encounter for screening for depression: Secondary | ICD-10-CM | POA: Diagnosis not present

## 2022-12-19 DIAGNOSIS — N289 Disorder of kidney and ureter, unspecified: Secondary | ICD-10-CM | POA: Diagnosis not present

## 2022-12-19 DIAGNOSIS — Z13220 Encounter for screening for lipoid disorders: Secondary | ICD-10-CM | POA: Diagnosis not present

## 2022-12-19 DIAGNOSIS — J069 Acute upper respiratory infection, unspecified: Secondary | ICD-10-CM | POA: Diagnosis not present

## 2022-12-19 DIAGNOSIS — F17210 Nicotine dependence, cigarettes, uncomplicated: Secondary | ICD-10-CM | POA: Diagnosis not present

## 2022-12-26 ENCOUNTER — Other Ambulatory Visit: Payer: Self-pay | Admitting: Nurse Practitioner

## 2022-12-26 DIAGNOSIS — Z1231 Encounter for screening mammogram for malignant neoplasm of breast: Secondary | ICD-10-CM

## 2023-01-04 ENCOUNTER — Ambulatory Visit: Payer: Self-pay | Admitting: Urology

## 2023-01-26 ENCOUNTER — Encounter: Payer: Self-pay | Admitting: Urology

## 2023-01-26 ENCOUNTER — Ambulatory Visit: Payer: Self-pay | Admitting: Urology

## 2023-01-30 DIAGNOSIS — Z1389 Encounter for screening for other disorder: Secondary | ICD-10-CM | POA: Diagnosis not present

## 2023-01-30 DIAGNOSIS — R300 Dysuria: Secondary | ICD-10-CM | POA: Diagnosis not present

## 2023-01-30 DIAGNOSIS — R102 Pelvic and perineal pain: Secondary | ICD-10-CM | POA: Diagnosis not present

## 2023-01-30 DIAGNOSIS — F17200 Nicotine dependence, unspecified, uncomplicated: Secondary | ICD-10-CM | POA: Diagnosis not present

## 2023-03-07 ENCOUNTER — Encounter: Payer: Self-pay | Admitting: Urology

## 2023-03-07 ENCOUNTER — Ambulatory Visit (INDEPENDENT_AMBULATORY_CARE_PROVIDER_SITE_OTHER): Payer: 59 | Admitting: Urology

## 2023-03-07 VITALS — BP 100/68 | HR 83 | Ht 66.0 in | Wt 143.0 lb

## 2023-03-07 DIAGNOSIS — R300 Dysuria: Secondary | ICD-10-CM | POA: Diagnosis not present

## 2023-03-07 DIAGNOSIS — R3915 Urgency of urination: Secondary | ICD-10-CM

## 2023-03-07 DIAGNOSIS — R350 Frequency of micturition: Secondary | ICD-10-CM

## 2023-03-07 LAB — MICROSCOPIC EXAMINATION

## 2023-03-07 LAB — URINALYSIS, COMPLETE
Bilirubin, UA: NEGATIVE
Glucose, UA: NEGATIVE
Ketones, UA: NEGATIVE
Nitrite, UA: NEGATIVE
Protein,UA: NEGATIVE
RBC, UA: NEGATIVE
Specific Gravity, UA: 1.015 (ref 1.005–1.030)
Urobilinogen, Ur: 0.2 mg/dL (ref 0.2–1.0)
pH, UA: 6.5 (ref 5.0–7.5)

## 2023-03-07 MED ORDER — GEMTESA 75 MG PO TABS: 75.0000 mg | ORAL_TABLET | Freq: Every day | ORAL | Status: DC

## 2023-03-07 NOTE — Progress Notes (Signed)
I, Maysun Anabel Bene, acting as a scribe for Riki Altes, MD., have documented all relevant documentation on the behalf of Riki Altes, MD,as directed by Riki Altes, MD while in the presence of Riki Altes, MD.  03/07/2023 3:08 PM   Karen Crane 1980-08-09 [id number]  Referring provider: Dudley Major, FNP [address] [place],  [place] [address]  Chief Complaint  Patient presents with   Dysuria    HPI: Karen Crane is a 42 y.o. female referred for evaluation of chronic dysuria.  Long history of multiple PCP and gynecology visits for chronic dysuria, dating back to 2020.  She has had multiple UA showing pyuria with significant squamous epithelial contamination and negative cultures. At one of her gynecology visits, it was recommended she be evaluated by urogynecology, however it does not look like she was ever seen.  Complaints of chronic frequency, urgency, and dysuria. She has noted blood on toilet tissue in the past when wiping.  Multiple cultures growing mixed flora.  History of recurrent bacterial vaginosis. 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   PMH: Past Medical History:  Diagnosis Date   Renal disorder    Vaginal Pap smear, abnormal     Surgical History: Past Surgical History:  Procedure Laterality Date   ovarian cysts     removal    Home Medications:  Allergies as of 03/07/2023       Reactions   Other Itching, Swelling, Rash   strawberries   Vicodin [hydrocodone-acetaminophen] Nausea And Vomiting        Medication List        Accurate as of March 07, 2023  3:08 PM. If you have any questions, ask your nurse or doctor.          STOP taking these medications    cephALEXin 500 MG capsule Commonly known as: KEFLEX Stopped by: Riki Altes   Clotrimazole 3 2 % vaginal cream Generic drug: clotrimazole Stopped by: Riki Altes   famotidine 20 MG tablet Commonly known as: PEPCID Stopped by: Riki Altes   fexofenadine-pseudoephedrine 60-120 MG 12 hr tablet Commonly known as: ALLEGRA-D Stopped by: Riki Altes   ibuprofen 600 MG tablet Commonly known as: ADVIL Stopped by: Riki Altes   ketorolac 10 MG tablet Commonly known as: TORADOL Stopped by: Riki Altes   nicotine 14 mg/24hr patch Commonly known as: NICODERM CQ - dosed in mg/24 hours Stopped by: Riki Altes   omeprazole 20 MG capsule Commonly known as: PRILOSEC Stopped by: Verna Czech Calyn Rubi   ondansetron 4 MG disintegrating tablet Commonly known as: ZOFRAN-ODT Stopped by: Verna Czech Undra Trembath   ondansetron 8 MG tablet Commonly known as: Zofran Stopped by: Riki Altes   pantoprazole 40 MG tablet Commonly known as: PROTONIX Stopped by: Riki Altes       TAKE these medications    Gemtesa 75 MG Tabs Generic drug: Vibegron Take 1 tablet (75 mg total) by mouth daily. Started by: Riki Altes   hydrOXYzine 25 MG tablet Commonly known as: ATARAX Take 25 mg by mouth at bedtime.   mirtazapine 30 MG tablet Commonly known as: REMERON Take 30 mg by mouth at bedtime.        Allergies:  Allergies  Allergen Reactions   Other Itching, Swelling and Rash    strawberries   Vicodin [  Hydrocodone-Acetaminophen] Nausea And Vomiting    Family History: Family History  Problem Relation Age of Onset   Healthy Mother    Heart attack Father    Seizures Sister     Social History:  reports that she has been smoking cigarettes. She has never used smokeless tobacco. She reports current alcohol use of about 5.0 standard drinks of alcohol per week. She reports that she does not currently use drugs after having used the following drugs: Cocaine and Marijuana.   Physical Exam: BP 100/68   Pulse 83   Ht 5\' 6"  (1.676 m)   Wt 143 lb (64.9 kg)   BMI 23.08 kg/m   Constitutional:  Alert, No acute distress. HEENT:  [place] AT Respiratory: Normal respiratory effort, no increased work of breathing. Psychiatric: Normal mood and affect.   Urinalysis Dipstick 1+ leukocytes, microscopy 11-30 WBC, 0-10 epis, 0-2 RBC.    Assessment & Plan:    1. Chronic dysuria UA today with pyuria and urine culture was repeated.  Will treat if urine culture positive. If urine culture negative, will place on low-dose antibiotic prophylaxis until cystoscopy can be performed.   2. Urinary frequency and urgency Trial Gemtesa 75 mg daily pending cystoscopy. Samples given. If cystoscopy unremarkable, I would recommend urogynecology opinion with her history of recurrent bacterial vaginosis.   I have reviewed the above documentation for accuracy and completeness, and I agree with the above.   Riki Altes, MD  [facility] [address], [address] [place], [place] [address] [phone]

## 2023-03-10 LAB — CULTURE, URINE COMPREHENSIVE

## 2023-03-13 ENCOUNTER — Ambulatory Visit: Payer: 59 | Admitting: Family Medicine

## 2023-03-13 ENCOUNTER — Other Ambulatory Visit: Payer: Self-pay | Admitting: Family Medicine

## 2023-03-13 ENCOUNTER — Encounter: Payer: Self-pay | Admitting: Nurse Practitioner

## 2023-03-13 VITALS — BP 113/70 | HR 80 | Ht 65.0 in | Wt 145.8 lb

## 2023-03-13 DIAGNOSIS — Z01419 Encounter for gynecological examination (general) (routine) without abnormal findings: Secondary | ICD-10-CM

## 2023-03-13 DIAGNOSIS — Z113 Encounter for screening for infections with a predominantly sexual mode of transmission: Secondary | ICD-10-CM

## 2023-03-13 DIAGNOSIS — F1010 Alcohol abuse, uncomplicated: Secondary | ICD-10-CM

## 2023-03-13 DIAGNOSIS — Z720 Tobacco use: Secondary | ICD-10-CM | POA: Diagnosis not present

## 2023-03-13 DIAGNOSIS — Z30013 Encounter for initial prescription of injectable contraceptive: Secondary | ICD-10-CM | POA: Diagnosis not present

## 2023-03-13 DIAGNOSIS — B9689 Other specified bacterial agents as the cause of diseases classified elsewhere: Secondary | ICD-10-CM

## 2023-03-13 DIAGNOSIS — Z3009 Encounter for other general counseling and advice on contraception: Secondary | ICD-10-CM | POA: Diagnosis not present

## 2023-03-13 DIAGNOSIS — F17200 Nicotine dependence, unspecified, uncomplicated: Secondary | ICD-10-CM

## 2023-03-13 DIAGNOSIS — Z3042 Encounter for surveillance of injectable contraceptive: Secondary | ICD-10-CM

## 2023-03-13 DIAGNOSIS — N760 Acute vaginitis: Secondary | ICD-10-CM

## 2023-03-13 LAB — WET PREP FOR TRICH, YEAST, CLUE
Trichomonas Exam: NEGATIVE
Yeast Exam: NEGATIVE

## 2023-03-13 MED ORDER — METRONIDAZOLE 500 MG PO TABS: 500.0000 mg | ORAL_TABLET | Freq: Two times a day (BID) | ORAL | Status: AC

## 2023-03-13 MED ORDER — MEDROXYPROGESTERONE ACETATE 150 MG/ML IM SUSP
150.0000 mg | INTRAMUSCULAR | Status: AC
Start: 2023-03-13 — End: 2024-06-05
  Administered 2023-03-13: 150 mg via INTRAMUSCULAR

## 2023-03-13 NOTE — Progress Notes (Signed)
[facility] DEPARTMENT [facility] [address] Main Number: [phone]  Family Planning Visit- Repeat Yearly Visit  Subjective:  Karen Crane is a 42 y.o. G1P0010  being seen today for an annual wellness visit and to discuss contraception options.   The patient is currently using Hormonal Injection for pregnancy prevention. Patient does not want a pregnancy in the next year.    report they are looking for a method that provides High efficacy at preventing pregnancy   Patient has the following medical problems: has Substance induced mood disorder (HCC); Alcohol abuse daily with DWI 06/2020; Smoker 1/2-1 ppd; and Cocaine abuse (HCC) last use 06/2020 on their problem list.  Chief Complaint  Patient presents with   Annual Exam    PE, Dep, STI testing    Patient reports to clinic for PE and depo with STI testing.   See flowsheet for other program required questions.   Body mass index is 24.26 kg/m. - Patient is eligible for diabetes screening based on BMI> 25 and age >35?  no HA1C ordered? not applicable  Patient reports 1 of partners in last year. Desires STI screening?  Yes   Has patient been screened once for HCV in the past?  No  No results found for: "HCVAB"  Does the patient have current of drug use, have a partner with drug use, and/or has been incarcerated since last result? No  If yes-- Screen for HCV through [facility]   Does the patient meet criteria for HBV testing? No  Criteria:  -Household, sexual or needle sharing contact with HBV -History of drug use -HIV positive -Those with known Hep C   Health Maintenance Due  Topic Date Due   DTaP/Tdap/Td (1 - Tdap) Never done   INFLUENZA VACCINE  11/30/2022   COVID-19 Vaccine (1 - 2023-24 season) Never done    Review of Systems  Constitutional:  Negative for weight loss.  Eyes:  Negative for blurred vision.  Respiratory:  Positive for shortness of breath. Negative for  cough.   Cardiovascular:  Negative for claudication.  Gastrointestinal:  Negative for nausea.       Rectal bleeding   Genitourinary:  Positive for dysuria and frequency.  Musculoskeletal:  Positive for myalgias.  Skin:  Negative for rash.  Neurological:  Negative for headaches.  Endo/Heme/Allergies:  Does not bruise/bleed easily.    The following portions of the patient's history were reviewed and updated as appropriate: allergies, current medications, past family history, past medical history, past social history, past surgical history and problem list. Problem list updated.  Objective:   Vitals:   03/13/23 1551  BP: 113/70  Pulse: 80  Weight: 145 lb 12.8 oz (66.1 kg)  Height: 5\' 5"  (1.651 m)    Physical Exam Vitals and nursing note reviewed.  Constitutional:      Appearance: Normal appearance.  HENT:     Head: Normocephalic and atraumatic.     Mouth/Throat:     Mouth: Mucous membranes are moist.     Pharynx: Oropharynx is clear. No oropharyngeal exudate or posterior oropharyngeal erythema.  Pulmonary:     Effort: Pulmonary effort is normal.  Abdominal:     General: Abdomen is flat.     Palpations: There is no mass.     Tenderness: There is no abdominal tenderness. There is no rebound.  Genitourinary:    General: Normal vulva.     Exam position: Lithotomy position.     Pubic Area:  No rash or pubic lice.      Labia:        Right: No rash or lesion.        Left: No rash or lesion.      Vagina: Vaginal discharge present. No erythema, bleeding or lesions.     Cervix: No cervical motion tenderness, discharge, friability, lesion or erythema.     Rectum: Normal.     Comments: pH = 4  Moderate amt of thick white discharge present   Declined bimanual exam Lymphadenopathy:     Head:     Right side of head: No preauricular or posterior auricular adenopathy.     Left side of head: No preauricular or posterior auricular adenopathy.     Upper Body:     Right upper body:  No supraclavicular, axillary or epitrochlear adenopathy.     Left upper body: No supraclavicular, axillary or epitrochlear adenopathy.     Lower Body: No right inguinal adenopathy. No left inguinal adenopathy.  Skin:    General: Skin is warm and dry.     Findings: No rash.  Neurological:     Mental Status: She is alert and oriented to person, place, and time.  Psychiatric:        Mood and Affect: Mood normal.        Behavior: Behavior normal.       Assessment and Plan:  Karen Crane is a 42 y.o. female G1P0010 presenting to the [facility] Department for an yearly wellness and contraception visit  1. Family planning, Depo-Provera contraception monitoring/administration  - medroxyPROGESTERone (DEPO-PROVERA) injection 150 mg  2. Family planning Contraception counseling: Reviewed options based on patient desire and reproductive life plan. Patient is interested in Hormonal Injection. This was provided to the patient today.   Risks, benefits, and typical effectiveness rates were reviewed.  Questions were answered.  Written information was also given to the patient to review.    The patient will follow up in  3 months for surveillance.  The patient was told to call with any further questions, or with any concerns about this method of contraception.  Emphasized use of condoms 100% of the time for STI prevention.  Educated on ECP and assessed need for ECP. Not indicated- covered under depo window.  - Chlamydia/Gonorrhea [place] [facility] - HIV [place] [facility] - Syphilis Serology, [place] [facility] - WET PREP FOR TRICH, YEAST, CLUE  3. Well woman exam with routine gynecological exam -reviewed CBE and mammograms today- pt reports she has never had a mammogram, declined CBE today  -report rectal bleeding due to hemorroids- this is a chronic problem for her -reports generalized muscle aches in the legs and lower back- endorses using a lidocaine patch from her neighbor which "really  helped my pain" -reports freqent urination- states she is being worked up for cystitis and having a scope on 12/3  5. Alcohol abuse daily with DWI 06/2020 -pt reports she quit drinking -she reports things have been "going better" for her since she stopped -patient is noticeably less anxious than previous encounters -encouraged to continue abstinence  6. Smoker 1/2-1 ppd -pt reports that she quit smoking for 1 week, as she had some patches, she reports that she smoked a cigarette for the first time in a week today- she states she "got stressed out" -encouraged to continue efforts to quit smoking -we reviewed resources available, including going back to PCP for Rx for more patches, and calling the quit line-  which can assist in getting patches and other smoking cessation medications -reports SOB- but states this is due to smoking, no red flag symptoms     Return in about 3 months (around 06/13/2023) for depo injection.  Future Appointments  Date Time Provider Department Center  04/04/2023 11:15 AM Stoioff, Verna Czech, MD [facility] None    Lenice Llamas, [place]

## 2023-03-13 NOTE — Progress Notes (Addendum)
Pt is here for PE, STD testing and Depo.   Wet mount results reviewed.  The patient was dispensed Metronidazole 500 mg #14 today. I provided counseling today regarding the medication. We discussed the medication, the side effects and when to call clinic. Patient given the opportunity to ask questions. Questions answered.  Depo 150 mg given IM in rt deltoid.  Reminder card to return in 11-13 weeks given to pt

## 2023-03-20 DIAGNOSIS — R300 Dysuria: Secondary | ICD-10-CM | POA: Diagnosis not present

## 2023-03-25 DIAGNOSIS — N261 Atrophy of kidney (terminal): Secondary | ICD-10-CM | POA: Diagnosis not present

## 2023-03-25 DIAGNOSIS — S0191XA Laceration without foreign body of unspecified part of head, initial encounter: Secondary | ICD-10-CM | POA: Diagnosis not present

## 2023-03-25 DIAGNOSIS — K219 Gastro-esophageal reflux disease without esophagitis: Secondary | ICD-10-CM | POA: Diagnosis not present

## 2023-03-25 DIAGNOSIS — F419 Anxiety disorder, unspecified: Secondary | ICD-10-CM | POA: Diagnosis not present

## 2023-03-25 DIAGNOSIS — R569 Unspecified convulsions: Secondary | ICD-10-CM | POA: Diagnosis not present

## 2023-03-25 DIAGNOSIS — Z043 Encounter for examination and observation following other accident: Secondary | ICD-10-CM | POA: Diagnosis not present

## 2023-03-25 DIAGNOSIS — S0101XA Laceration without foreign body of scalp, initial encounter: Secondary | ICD-10-CM | POA: Diagnosis not present

## 2023-03-25 DIAGNOSIS — W19XXXA Unspecified fall, initial encounter: Secondary | ICD-10-CM | POA: Diagnosis not present

## 2023-03-25 DIAGNOSIS — S0990XA Unspecified injury of head, initial encounter: Secondary | ICD-10-CM | POA: Diagnosis not present

## 2023-03-25 DIAGNOSIS — F1490 Cocaine use, unspecified, uncomplicated: Secondary | ICD-10-CM | POA: Diagnosis not present

## 2023-03-25 DIAGNOSIS — N189 Chronic kidney disease, unspecified: Secondary | ICD-10-CM | POA: Diagnosis not present

## 2023-03-25 DIAGNOSIS — M542 Cervicalgia: Secondary | ICD-10-CM | POA: Diagnosis not present

## 2023-03-26 NOTE — Addendum Note (Signed)
Addended by: Berdie Ogren on: 03/26/2023 12:36 PM   Modules accepted: Orders

## 2023-04-04 ENCOUNTER — Other Ambulatory Visit: Payer: 59 | Admitting: Urology

## 2023-04-05 DIAGNOSIS — R300 Dysuria: Secondary | ICD-10-CM | POA: Diagnosis not present

## 2023-04-05 DIAGNOSIS — R569 Unspecified convulsions: Secondary | ICD-10-CM | POA: Diagnosis not present

## 2023-04-05 DIAGNOSIS — Z1389 Encounter for screening for other disorder: Secondary | ICD-10-CM | POA: Diagnosis not present

## 2023-04-05 DIAGNOSIS — Z4802 Encounter for removal of sutures: Secondary | ICD-10-CM | POA: Diagnosis not present

## 2023-04-11 ENCOUNTER — Other Ambulatory Visit: Payer: Self-pay

## 2023-04-11 ENCOUNTER — Emergency Department: Payer: 59

## 2023-04-11 ENCOUNTER — Emergency Department
Admission: EM | Admit: 2023-04-11 | Discharge: 2023-04-11 | Disposition: A | Discharge status: Home / Self Care | Payer: 59 | Attending: Emergency Medicine | Admitting: Emergency Medicine

## 2023-04-11 DIAGNOSIS — K5900 Constipation, unspecified: Secondary | ICD-10-CM

## 2023-04-11 DIAGNOSIS — R109 Unspecified abdominal pain: Secondary | ICD-10-CM | POA: Diagnosis not present

## 2023-04-11 DIAGNOSIS — Z202 Contact with and (suspected) exposure to infections with a predominantly sexual mode of transmission: Secondary | ICD-10-CM | POA: Diagnosis not present

## 2023-04-11 DIAGNOSIS — B9689 Other specified bacterial agents as the cause of diseases classified elsewhere: Secondary | ICD-10-CM | POA: Diagnosis not present

## 2023-04-11 DIAGNOSIS — N760 Acute vaginitis: Secondary | ICD-10-CM | POA: Diagnosis not present

## 2023-04-11 DIAGNOSIS — N10 Acute tubulo-interstitial nephritis: Secondary | ICD-10-CM | POA: Diagnosis not present

## 2023-04-11 DIAGNOSIS — N12 Tubulo-interstitial nephritis, not specified as acute or chronic: Secondary | ICD-10-CM | POA: Insufficient documentation

## 2023-04-11 DIAGNOSIS — K5989 Other specified functional intestinal disorders: Secondary | ICD-10-CM | POA: Diagnosis not present

## 2023-04-11 LAB — URINALYSIS, ROUTINE W REFLEX MICROSCOPIC
Bilirubin Urine: NEGATIVE
Glucose, UA: NEGATIVE mg/dL
Ketones, ur: NEGATIVE mg/dL
Nitrite: NEGATIVE
Protein, ur: NEGATIVE mg/dL
Specific Gravity, Urine: 1.015 (ref 1.005–1.030)
pH: 5 (ref 5.0–8.0)

## 2023-04-11 LAB — CHLAMYDIA/NGC RT PCR (ARMC ONLY)
Chlamydia Tr: NOT DETECTED
N gonorrhoeae: NOT DETECTED

## 2023-04-11 LAB — WET PREP, GENITAL
Sperm: NONE SEEN
Trich, Wet Prep: NONE SEEN
WBC, Wet Prep HPF POC: >=10 — AB (ref <10)
Yeast Wet Prep HPF POC: NONE SEEN

## 2023-04-11 LAB — PREGNANCY, URINE: Preg Test, Ur: NEGATIVE

## 2023-04-11 MED ORDER — MAGNESIUM CITRATE PO SOLN: 1.0000 | Freq: Once | ORAL | 0 refills | Status: AC

## 2023-04-11 MED ORDER — METRONIDAZOLE 500 MG PO TABS: 500.0000 mg | ORAL_TABLET | Freq: Two times a day (BID) | ORAL | 0 refills | Status: AC

## 2023-04-11 MED ORDER — LEVOFLOXACIN 250 MG PO TABS: 500.0000 mg | ORAL_TABLET | Freq: Every day | ORAL | 0 refills | Status: AC

## 2023-04-11 MED ORDER — FLUCONAZOLE 150 MG PO TABS: 150.0000 mg | ORAL_TABLET | Freq: Every day | ORAL | 0 refills | Status: AC

## 2023-04-11 NOTE — Discharge Instructions (Addendum)
Please take the antibiotics as prescribed.  Drink the entire bottle of the magnesium citrate solution to help with your constipation.  Take 1 dose of the Diflucan when you start taking your antibiotics and the second dose when you complete your antibiotics.  Return to the ED if you have any new or worsening symptoms.

## 2023-04-11 NOTE — ED Notes (Signed)
AVS provided by edp was discussed with pt. Pt verbalized understanding with no additional questions at this time. Pt unable to complete dc VS - sts her ride is waiting for her and doesnt want them to leave her

## 2023-04-11 NOTE — ED Triage Notes (Addendum)
Pt reports she hasn't had a BM in 4 days. Pt c/o generalized abd discomfort. Pt states she has urge to go but nothing is coming out. Pt reports she was also exposed to STDs and is having vaginal burning.

## 2023-04-11 NOTE — ED Provider Notes (Signed)
[facility] Provider Note    Event Date/Time   First MD Initiated Contact with Patient 04/11/23 2148     (approximate)   History   Constipation   HPI  Karen Crane is a 42 y.o. female with no PMH presents for evaluation of constipation and STI exposure.  Patient states she has not had a bowel movement in 4 days. She feels like she needs to go but has been straining quite a bit which is making her hemorrhoids worse.  She describes what sounds like having a rectal prolapse but was able to reduce it after a couple days.  Patient also endorses dysuria, back pain and fevers at home.  She says she was exposed to an STI and would like testing for this.      Physical Exam   Triage Vital Signs: ED Triage Vitals [04/11/23 2101]  Encounter Vitals Group     BP (!) 148/81     Systolic BP Percentile      Diastolic BP Percentile      Pulse Rate 94     Resp 18     Temp 98.2 F (36.8 C)     Temp Source Oral     SpO2 100 %     Weight 140 lb (63.5 kg)     Height 5\' 4"  (1.626 m)     Head Circumference      Peak Flow      Pain Score 7     Pain Loc      Pain Education      Exclude from Growth Chart     Most recent vital signs: Vitals:   04/11/23 2101  BP: (!) 148/81  Pulse: 94  Resp: 18  Temp: 98.2 F (36.8 C)  SpO2: 100%   General: Awake, no distress.  CV:  Good peripheral perfusion.  RRR. Resp:  Normal effort.  CTAB. Abd:  No distention.  Soft, nontender, bilateral CVA tenderness. Rectal:  Hemorrhoids present.  No other lesions.   ED Results / Procedures / Treatments   Labs (all labs ordered are listed, but only abnormal results are displayed) Labs Reviewed  WET PREP, GENITAL - Abnormal; Notable for the following components:      Result Value   Clue Cells Wet Prep HPF POC PRESENT (*)    WBC, Wet Prep HPF POC >=10 (*)    All other components within normal limits  URINALYSIS, ROUTINE W REFLEX MICROSCOPIC - Abnormal; Notable for the  following components:   Color, Urine YELLOW (*)    APPearance CLEAR (*)    Hgb urine dipstick LARGE (*)    Leukocytes,Ua LARGE (*)    Bacteria, UA RARE (*)    All other components within normal limits  CHLAMYDIA/NGC RT PCR ([facility] ONLY)            PREGNANCY, URINE   RADIOLOGY:  Dominant x-ray obtained, interpreted the images as well as reviewed the radiologist report which showed a nonobstructive bowel gas pattern and mild fecal stasis in the ascending and proximal transverse colon.  PROCEDURES:  Critical Care performed: No  Procedures   MEDICATIONS ORDERED IN ED: Medications - No data to display   IMPRESSION / MDM / ASSESSMENT AND PLAN / ED COURSE  I reviewed the triage vital signs and the nursing notes.                             42 year old  female presents for evaluation of constipation, dysuria and STI exposure.  Patient was hypertensive in triage otherwise vital signs are stable.  Differential diagnosis includes, but is not limited to, constipation, hemorrhoids, anal fissure, rectal prolapse, STI, UTI, pyelonephritis.  Patient's presentation is most consistent with acute complicated illness / injury requiring diagnostic workup.  Urinalysis shows large hemoglobin, large leukocytes, 20-51 WBCs and rare bacteria with mucus present.  Pregnancy test was negative.  Wet prep was positive for clue cells and WBCs.  Gonorrhea and Chlamydia test was negative.  Abdominal x-ray showed nonobstructive bowel gas pattern with some fecal stasis.  With urinalysis showing evidence of infection as well as patient having CVA tenderness and fevers at home I am concerned about pyelonephritis so she will be started on antibiotics for this.  She will also be given metronidazole for treatment of BV.  For patient's constipation I will give her magnesium citrate.  We also discussed over-the-counter management of her hemorrhoids.  And since she is getting multiple antibiotics and reports history of yeast  infections I will also send some Diflucan.  She voiced understanding, all questions were answered and she was stable at discharge.      FINAL CLINICAL IMPRESSION(S) / ED DIAGNOSES   Final diagnoses:  Bacterial vaginosis  Pyelonephritis  Constipation, unspecified constipation type     Rx / DC Orders   ED Discharge Orders          Ordered    levofloxacin (LEVAQUIN) 250 MG tablet  Daily        04/11/23 2343    metroNIDAZOLE (FLAGYL) 500 MG tablet  2 times daily        04/11/23 2343    magnesium citrate SOLN   Once        04/11/23 2343    fluconazole (DIFLUCAN) 150 MG tablet  Daily        04/11/23 2343             Note:  This document was prepared using Dragon voice recognition software and may include unintentional dictation errors.   Cameron Ali, PA-C 04/11/23 2354    Minna Antis, MD 04/12/23 2223

## 2023-04-18 NOTE — Addendum Note (Signed)
Addended by: Heywood Bene on: 04/18/2023 02:12 PM   Modules accepted: Orders

## 2023-05-01 ENCOUNTER — Other Ambulatory Visit: Payer: Self-pay | Admitting: *Deleted

## 2023-05-01 DIAGNOSIS — R102 Pelvic and perineal pain: Secondary | ICD-10-CM | POA: Diagnosis not present

## 2023-05-01 DIAGNOSIS — R300 Dysuria: Secondary | ICD-10-CM | POA: Diagnosis not present

## 2023-05-01 DIAGNOSIS — F17200 Nicotine dependence, unspecified, uncomplicated: Secondary | ICD-10-CM | POA: Diagnosis not present

## 2023-05-01 DIAGNOSIS — Z1389 Encounter for screening for other disorder: Secondary | ICD-10-CM | POA: Diagnosis not present

## 2023-05-01 DIAGNOSIS — N760 Acute vaginitis: Secondary | ICD-10-CM | POA: Diagnosis not present

## 2023-05-01 MED ORDER — GEMTESA 75 MG PO TABS: 75.0000 mg | ORAL_TABLET | Freq: Every day | ORAL | 11 refills | Status: DC

## 2023-05-08 ENCOUNTER — Other Ambulatory Visit: Payer: Self-pay | Admitting: *Deleted

## 2023-05-08 MED ORDER — GEMTESA 75 MG PO TABS: 75.0000 mg | ORAL_TABLET | Freq: Every day | ORAL | 11 refills | Status: AC

## 2023-05-22 ENCOUNTER — Telehealth: Payer: Self-pay | Admitting: Urology

## 2023-05-22 NOTE — Telephone Encounter (Signed)
Pt called and said that the Leslye Peer was called into [facility] on [address]. And it is not covered by her insurance, it will cost $586. [facility] sent a auth to be signed and it has not been sent back to them yet.

## 2023-05-22 NOTE — Telephone Encounter (Signed)
Talked with patient and advised her we are waiting on [facility] to send it over , so we can start on the prior auth . She understand and is calling [facility] .

## 2023-05-25 NOTE — Telephone Encounter (Signed)
Pt called back regarding Karen Crane and stated that insurance has denied coverage. Pt is requesting to have samples due to her not being able to afford medication through the pharmacy. Please advise patient.

## 2023-05-30 ENCOUNTER — Other Ambulatory Visit: Payer: 59 | Admitting: Urology

## 2023-06-06 ENCOUNTER — Other Ambulatory Visit: Payer: 59 | Admitting: Urology

## 2023-07-03 ENCOUNTER — Ambulatory Visit: Payer: 59

## 2023-07-03 ENCOUNTER — Encounter: Admitting: Family Medicine

## 2023-07-03 NOTE — Progress Notes (Signed)
 Left without being seen.Burt Knack, RN

## 2023-07-04 ENCOUNTER — Other Ambulatory Visit: Payer: Self-pay | Admitting: Urology

## 2023-07-04 ENCOUNTER — Encounter: Payer: Self-pay | Admitting: Urology

## 2023-07-16 ENCOUNTER — Ambulatory Visit

## 2023-09-07 ENCOUNTER — Ambulatory Visit

## 2023-09-23 ENCOUNTER — Emergency Department
Admission: EM | Admit: 2023-09-23 | Discharge: 2023-09-23 | Disposition: A | Discharge status: Home / Self Care | Payer: Self-pay | Attending: Emergency Medicine | Admitting: Emergency Medicine

## 2023-09-23 ENCOUNTER — Emergency Department: Payer: Self-pay

## 2023-09-23 ENCOUNTER — Other Ambulatory Visit: Payer: Self-pay

## 2023-09-23 DIAGNOSIS — R300 Dysuria: Secondary | ICD-10-CM | POA: Insufficient documentation

## 2023-09-23 DIAGNOSIS — R079 Chest pain, unspecified: Secondary | ICD-10-CM

## 2023-09-23 DIAGNOSIS — R0789 Other chest pain: Secondary | ICD-10-CM | POA: Insufficient documentation

## 2023-09-23 DIAGNOSIS — R1032 Left lower quadrant pain: Secondary | ICD-10-CM | POA: Insufficient documentation

## 2023-09-23 DIAGNOSIS — R109 Unspecified abdominal pain: Secondary | ICD-10-CM

## 2023-09-23 LAB — CBC
HCT: 41.4 % (ref 36.0–46.0)
Hemoglobin: 14.3 g/dL (ref 12.0–15.0)
MCH: 31.9 pg (ref 26.0–34.0)
MCHC: 34.5 g/dL (ref 30.0–36.0)
MCV: 92.4 fL (ref 80.0–100.0)
Platelets: 306 10*3/uL (ref 150–400)
RBC: 4.48 MIL/uL (ref 3.87–5.11)
RDW: 11.9 % (ref 11.5–15.5)
WBC: 7.7 10*3/uL (ref 4.0–10.5)
nRBC: 0 % (ref 0.0–0.2)

## 2023-09-23 LAB — CHLAMYDIA/NGC RT PCR (ARMC ONLY)
Chlamydia Tr: NOT DETECTED
N gonorrhoeae: NOT DETECTED

## 2023-09-23 LAB — BASIC METABOLIC PANEL WITH GFR
Anion gap: 12 (ref 5–15)
BUN: 19 mg/dL (ref 6–20)
CO2: 22 mmol/L (ref 22–32)
Calcium: 9.4 mg/dL (ref 8.9–10.3)
Chloride: 103 mmol/L (ref 98–111)
Creatinine, Ser: 1.28 mg/dL — ABNORMAL HIGH (ref 0.44–1.00)
GFR, Estimated: 53 mL/min — ABNORMAL LOW (ref >60)
Glucose, Bld: 86 mg/dL (ref 70–99)
Potassium: 3.9 mmol/L (ref 3.5–5.1)
Sodium: 137 mmol/L (ref 135–145)

## 2023-09-23 LAB — URINALYSIS, ROUTINE W REFLEX MICROSCOPIC
Bacteria, UA: NONE SEEN
Bilirubin Urine: NEGATIVE
Glucose, UA: NEGATIVE mg/dL
Ketones, ur: NEGATIVE mg/dL
Nitrite: NEGATIVE
Protein, ur: NEGATIVE mg/dL
Specific Gravity, Urine: 1.01 (ref 1.005–1.030)
pH: 7 (ref 5.0–8.0)

## 2023-09-23 LAB — HEPATIC FUNCTION PANEL
ALT: 17 U/L (ref 0–44)
AST: 23 U/L (ref 15–41)
Albumin: 4.5 g/dL (ref 3.5–5.0)
Alkaline Phosphatase: 47 U/L (ref 38–126)
Bilirubin, Direct: 0.2 mg/dL (ref 0.0–0.2)
Indirect Bilirubin: 1.1 mg/dL — ABNORMAL HIGH (ref 0.3–0.9)
Total Bilirubin: 1.3 mg/dL — ABNORMAL HIGH (ref 0.0–1.2)
Total Protein: 7.5 g/dL (ref 6.5–8.1)

## 2023-09-23 LAB — WET PREP, GENITAL
Clue Cells Wet Prep HPF POC: NONE SEEN
Sperm: NONE SEEN
Trich, Wet Prep: NONE SEEN
WBC, Wet Prep HPF POC: <10 (ref <10)
Yeast Wet Prep HPF POC: NONE SEEN

## 2023-09-23 LAB — LIPASE, BLOOD: Lipase: 26 U/L (ref 11–51)

## 2023-09-23 LAB — POC URINE PREG, ED: Preg Test, Ur: NEGATIVE

## 2023-09-23 LAB — TROPONIN I (HIGH SENSITIVITY)
Troponin I (High Sensitivity): 12 ng/L (ref <18)
Troponin I (High Sensitivity): 11 ng/L (ref <18)

## 2023-09-23 MED ORDER — SODIUM CHLORIDE 0.9 % IV BOLUS
1000.0000 mL | Freq: Once | INTRAVENOUS | Status: AC
  Administered 2023-09-23: 1000 mL via INTRAVENOUS

## 2023-09-23 MED ORDER — LIDOCAINE 5 % EX PTCH
1.0000 | MEDICATED_PATCH | CUTANEOUS | Status: DC
  Administered 2023-09-23: 1 via TRANSDERMAL
  Filled 2023-09-23: qty 1

## 2023-09-23 MED ORDER — IOHEXOL 350 MG/ML SOLN
100.0000 mL | Freq: Once | INTRAVENOUS | Status: AC | PRN
  Administered 2023-09-23: 100 mL via INTRAVENOUS

## 2023-09-23 MED ORDER — CEPHALEXIN 500 MG PO CAPS: 500.0000 mg | ORAL_CAPSULE | Freq: Four times a day (QID) | ORAL | 0 refills | Status: AC

## 2023-09-23 MED ORDER — ONDANSETRON HCL 4 MG/2ML IJ SOLN
4.0000 mg | Freq: Once | INTRAMUSCULAR | Status: AC
Start: 2023-09-23 — End: 2023-09-23
  Administered 2023-09-23: 4 mg via INTRAVENOUS
  Filled 2023-09-23: qty 2

## 2023-09-23 MED ORDER — FENTANYL CITRATE PF 50 MCG/ML IJ SOSY
50.0000 ug | PREFILLED_SYRINGE | Freq: Once | INTRAMUSCULAR | Status: AC
Start: 2023-09-23 — End: 2023-09-23
  Administered 2023-09-23: 50 ug via INTRAVENOUS
  Filled 2023-09-23: qty 1

## 2023-09-23 NOTE — Discharge Instructions (Addendum)
 Please follow-up with your primary care provider.  Your images today were normal.

## 2023-09-23 NOTE — ED Notes (Signed)
 Unable to get blood in triage. Pt asked to wait for second stick,

## 2023-09-23 NOTE — ED Provider Notes (Addendum)
 [facility] Provider Note    Event Date/Time   First MD Initiated Contact with Patient 09/23/23 1709     (approximate)   History   Chest Pain and Flank Pain   HPI  Karen Crane is a 43 y.o. 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  Patient reports feeling like she is got some palpitations and some chest pressure.  She also reports some left-sided back pain radiating to her abdomen that started this morning.  She reports some chronic issues with back pain but this feels different.  She denies any new swelling in her legs, calf tenderness.  She does report some dysuria.  Denies ever having a known kidney stone.  Does report having a history of pyelonephritis.  No obvious shortness of breath.  She denies any new sexual partners or new concerns for vaginal discharge but she does report having BV frequently and sometimes this can cause some issues with the dysuria as well.  She would like to be tested for this today.  She reports that she is post follow-up with urology for cystoscopy given recurrent UTIs but she has not done that yet  Physical Exam   Triage Vital Signs: ED Triage Vitals  Encounter Vitals Group     BP 09/23/23 1659 (!) 142/83     Systolic BP Percentile --      Diastolic BP Percentile --      Pulse Rate 09/23/23 1659 90     Resp 09/23/23 1659 16     Temp 09/23/23 1659 98 F (36.7 C)     Temp Source 09/23/23 1659 Oral     SpO2 09/23/23 1659 98 %     Weight --      Height 09/23/23 1655 5\' 4"  (1.626 m)     Head Circumference --      Peak Flow --      Pain Score 09/23/23 1655 7     Pain Loc --      Pain Education --      Exclude from Growth Chart --     Most recent vital signs: Vitals:   09/23/23 1659  BP: (!) 142/83  Pulse: 90  Resp: 16  Temp: 98 F (36.7 C)  SpO2: 98%     General: Awake, no distress.  CV:  Good peripheral perfusion.  Resp:  Normal effort.  Abd:  No  distention.  Tender in the left lower quadrant. Other:  No swelling in legs.  No calf tenderness   ED Results / Procedures / Treatments   Labs (all labs ordered are listed, but only abnormal results are displayed) Labs Reviewed  BASIC METABOLIC PANEL WITH GFR  CBC  URINALYSIS, ROUTINE W REFLEX MICROSCOPIC  POC URINE PREG, ED  TROPONIN I (HIGH SENSITIVITY)     EKG  My interpretation of EKG:  Normal sinus rhythm 92 without any ST elevation or T wave inversions, normal intervals  RADIOLOGY I have reviewed the xray personally and interpreted no PNA    PROCEDURES:  Critical Care performed: No  Procedures   MEDICATIONS ORDERED IN ED: Medications  fentaNYL  (SUBLIMAZE ) injection 50 mcg (has no administration in time range)  ondansetron  (ZOFRAN ) injection 4 mg (has no administration in time range)  lidocaine (LIDODERM) 5 % 1 patch (has no administration in time range)     IMPRESSION / MDM / ASSESSMENT AND PLAN / ED COURSE  I reviewed the triage  vital signs and the nursing notes.   Patient's presentation is most consistent with acute presentation with potential threat to life or bodily function.   Patient comes in with flank pain.  Could be related to her known history of recurrent pyelonephritis's but patient is stating that she has wanted a CT imaging to try to figure out why she has had this happen recurrently.  She denies any known history of kidney stones.  Will get CT imaging given the chest pain as well as the back pain to ensure no evidence of dissection, kidney stone, pyelonephritis, abscess or other acute pathology.  Patient given some IV fentanyl  IV Zofran  to help with symptoms.   Present test is negative.  Wet prep is negative.  BMP shows stable creatinine at 1.28.  CBC reassuring troponin is slightly elevated at 12 we will get a repeat.  Urine without evidence of obvious UTI will send for urine culture.  Patient will be handed off to oncoming team pending  results.  The patient is on the cardiac monitor to evaluate for evidence of arrhythmia and/or significant heart rate changes.      FINAL CLINICAL IMPRESSION(S) / ED DIAGNOSES   Final diagnoses:  Flank pain  Chest pain, unspecified type     Rx / DC Orders   ED Discharge Orders     None        Note:  This document was prepared using Dragon voice recognition software and may include unintentional dictation errors.   Lubertha Rush, MD 09/23/23 Hardie Leyland    Lubertha Rush, MD 09/23/23 [phone]

## 2023-09-23 NOTE — ED Notes (Signed)
 56 yof with a c/c of chest pain, back pain, and a headache. The pt advised she does have a kidney disease. The pt is warm, pink, and dry. The pt is alert and oriented x 4. Call bell left at bedside.

## 2023-09-23 NOTE — ED Triage Notes (Signed)
 Pt to ed from home via POV for chest pain and flank pain. Pt states "I have a kidney disease and I am always in pain but today it got worse". Pt is caox4, in no acute distress and ambulatory in triage.

## 2023-09-23 NOTE — ED Provider Notes (Signed)
-----------------------------------------   7:01 PM on 09/23/2023 -----------------------------------------  Blood pressure (!) 142/83, pulse 90, temperature 98 F (36.7 C), temperature source Oral, resp. rate 16, height 5\' 4"  (1.626 m), SpO2 98%.  Assuming care from Dr. Peggi Bowels.  In short, Karen Crane is a 43 y.o. female with a chief complaint of Chest Pain and Flank Pain .  Refer to the original H&P for additional details.  The current plan of care is to await CT angio. Presuming results are negative patient will be discharged home.   ----------------------------------------- 9:34 PM on 09/23/2023 -----------------------------------------  Updated patient with CT angio results which were reassuring.  Second troponin reassuring.  Patient stable condition for discharge home.  Prescription for Keflex  sent to pharmacy.  Advised to follow-up with primary care provider in 3 days for further management         Billye Buerger, PA-C 09/23/23 2135    Lubertha Rush, MD 09/24/23 2248

## 2023-09-23 NOTE — ED Notes (Signed)
 IV team orders placed.

## 2023-09-25 ENCOUNTER — Encounter: Admitting: Obstetrics & Gynecology

## 2023-09-25 LAB — URINE CULTURE: Culture: 20000 — AB

## 2023-12-24 ENCOUNTER — Emergency Department
Admission: EM | Admit: 2023-12-24 | Discharge: 2023-12-24 | Disposition: A | Discharge status: Home / Self Care | Payer: Self-pay | Attending: Emergency Medicine | Admitting: Emergency Medicine

## 2023-12-24 ENCOUNTER — Other Ambulatory Visit: Payer: Self-pay

## 2023-12-24 ENCOUNTER — Emergency Department: Payer: Self-pay

## 2023-12-24 DIAGNOSIS — R519 Headache, unspecified: Secondary | ICD-10-CM | POA: Insufficient documentation

## 2023-12-24 DIAGNOSIS — R0789 Other chest pain: Secondary | ICD-10-CM | POA: Insufficient documentation

## 2023-12-24 DIAGNOSIS — R569 Unspecified convulsions: Secondary | ICD-10-CM | POA: Insufficient documentation

## 2023-12-24 DIAGNOSIS — F1490 Cocaine use, unspecified, uncomplicated: Secondary | ICD-10-CM | POA: Insufficient documentation

## 2023-12-24 LAB — COMPREHENSIVE METABOLIC PANEL WITH GFR
ALT: 14 U/L (ref 0–44)
AST: 22 U/L (ref 15–41)
Albumin: 4.1 g/dL (ref 3.5–5.0)
Alkaline Phosphatase: 44 U/L (ref 38–126)
Anion gap: 12 (ref 5–15)
BUN: 22 mg/dL — ABNORMAL HIGH (ref 6–20)
CO2: 21 mmol/L — ABNORMAL LOW (ref 22–32)
Calcium: 9.5 mg/dL (ref 8.9–10.3)
Chloride: 106 mmol/L (ref 98–111)
Creatinine, Ser: 1.24 mg/dL — ABNORMAL HIGH (ref 0.44–1.00)
GFR, Estimated: 55 mL/min — ABNORMAL LOW (ref >60)
Glucose, Bld: 123 mg/dL — ABNORMAL HIGH (ref 70–99)
Potassium: 3.6 mmol/L (ref 3.5–5.1)
Sodium: 139 mmol/L (ref 135–145)
Total Bilirubin: 0.9 mg/dL (ref 0.0–1.2)
Total Protein: 6.7 g/dL (ref 6.5–8.1)

## 2023-12-24 LAB — URINALYSIS, W/ REFLEX TO CULTURE (INFECTION SUSPECTED)
Bacteria, UA: NONE SEEN
Bilirubin Urine: NEGATIVE
Glucose, UA: NEGATIVE mg/dL
Ketones, ur: NEGATIVE mg/dL
Leukocytes,Ua: NEGATIVE
Nitrite: NEGATIVE
Protein, ur: NEGATIVE mg/dL
RBC / HPF: 0 RBC/hpf (ref 0–5)
Specific Gravity, Urine: 1.003 — ABNORMAL LOW (ref 1.005–1.030)
pH: 6 (ref 5.0–8.0)

## 2023-12-24 LAB — CBC WITH DIFFERENTIAL/PLATELET
Abs Immature Granulocytes: 0.02 K/uL (ref 0.00–0.07)
Basophils Absolute: 0.1 K/uL (ref 0.0–0.1)
Basophils Relative: 1 %
Eosinophils Absolute: 0.2 K/uL (ref 0.0–0.5)
Eosinophils Relative: 2 %
HCT: 39.9 % (ref 36.0–46.0)
Hemoglobin: 13.6 g/dL (ref 12.0–15.0)
Immature Granulocytes: 0 %
Lymphocytes Relative: 19 %
Lymphs Abs: 1.4 K/uL (ref 0.7–4.0)
MCH: 31.4 pg (ref 26.0–34.0)
MCHC: 34.1 g/dL (ref 30.0–36.0)
MCV: 92.1 fL (ref 80.0–100.0)
Monocytes Absolute: 0.5 K/uL (ref 0.1–1.0)
Monocytes Relative: 7 %
Neutro Abs: 5.1 K/uL (ref 1.7–7.7)
Neutrophils Relative %: 71 %
Platelets: 285 K/uL (ref 150–400)
RBC: 4.33 MIL/uL (ref 3.87–5.11)
RDW: 11.9 % (ref 11.5–15.5)
WBC: 7.3 K/uL (ref 4.0–10.5)
nRBC: 0 % (ref 0.0–0.2)

## 2023-12-24 LAB — URINE DRUG SCREEN, QUALITATIVE (ARMC ONLY)
Amphetamines, Ur Screen: NOT DETECTED
Barbiturates, Ur Screen: NOT DETECTED
Benzodiazepine, Ur Scrn: NOT DETECTED
Cannabinoid 50 Ng, Ur ~~LOC~~: NOT DETECTED
Cocaine Metabolite,Ur ~~LOC~~: POSITIVE — AB
MDMA (Ecstasy)Ur Screen: NOT DETECTED
Methadone Scn, Ur: NOT DETECTED
Opiate, Ur Screen: NOT DETECTED
Phencyclidine (PCP) Ur S: NOT DETECTED
Tricyclic, Ur Screen: NOT DETECTED

## 2023-12-24 LAB — ETHANOL: Alcohol, Ethyl (B): <15 mg/dL (ref <15)

## 2023-12-24 LAB — TROPONIN I (HIGH SENSITIVITY)
Troponin I (High Sensitivity): 8 ng/L (ref <18)
Troponin I (High Sensitivity): 9 ng/L (ref <18)

## 2023-12-24 LAB — HCG, QUANTITATIVE, PREGNANCY: hCG, Beta Chain, Quant, S: 2 m[IU]/mL (ref <5)

## 2023-12-24 MED ORDER — LACTATED RINGERS IV BOLUS
1000.0000 mL | Freq: Once | INTRAVENOUS | Status: AC
  Administered 2023-12-24: 1000 mL via INTRAVENOUS

## 2023-12-24 MED ORDER — METOCLOPRAMIDE HCL 5 MG/ML IJ SOLN
10.0000 mg | Freq: Once | INTRAMUSCULAR | Status: AC
  Administered 2023-12-24: 10 mg via INTRAVENOUS
  Filled 2023-12-24: qty 2

## 2023-12-24 MED ORDER — ACETAMINOPHEN 500 MG PO TABS
1000.0000 mg | ORAL_TABLET | Freq: Once | ORAL | Status: AC
  Administered 2023-12-24: 1000 mg via ORAL
  Filled 2023-12-24: qty 2

## 2023-12-24 NOTE — ED Notes (Signed)
 Pt ambulated to in room toilet with minimal assistance from staff. Pt has a steady, even gait. Pt returned to bed, and was reconnected to VS monitoring equipment. Pt tolerated activity well. Pt's bed is in the lowest, locked position with call bell in reach.

## 2023-12-24 NOTE — ED Triage Notes (Addendum)
 Pt arrives via [facility] from work. Pt had been picked up from work by a friend and had a witnessed seizure that lasted 20-30 seconds that was full body shaking. Pt was postictal with EMS. Pt told EMS that they woke up with a HA this morning and they were not sure if they had a seizure overnight. Pt's last seizure was in November and does not take medications for their seizures. Pt has a Hx of drug and etoh use; last cocaine use was Saturday and last ETOH was in July. Pt denies N/V/D. Pt is A&Ox4 during triage.

## 2023-12-24 NOTE — ED Notes (Signed)
 Called CCMD to add pt to monitoring.

## 2023-12-24 NOTE — ED Provider Notes (Signed)
 [facility] Provider Note    Event Date/Time   First MD Initiated Contact with Patient 12/24/23 0840     (approximate)   History   Seizures   HPI  Karen Crane is a 43 y.o. female substance use, substance-induced mood disorder, seizures, presenting with a seizure.  Was witnessed by friend while they were on the way to work.  Independent history from EMS, friend described about 20 to 30 seconds of GTC.  Patient was postictal.  May have bitten her inner lip.  They did not have to give her any medications.  Glucose was 110s.  Patient states that she has some chest tightness, headache.  She denies any fever, neck stiffness, cough, shortness of breath, nausea vomiting or diarrhea, no urinary symptoms or vaginal discharge.  States that she has a monitor for alcohol  and has not had any alcohol  use since July.  Did use cocaine 2 days ago.  States that she has history of seizures but is not on any medications, and states that in the past neurology had thought that her seizures were related to her substance use.  Has not followed up with neurology recently.  Patient also noted a headache this morning.  States that she was not sure if she had a seizure last night.  Does have a boyfriend who did not witness any shaking episodes last night.  Independent history obtained from EMS as above.      Physical Exam   Triage Vital Signs: ED Triage Vitals  Encounter Vitals Group     BP      Girls Systolic BP Percentile      Girls Diastolic BP Percentile      Boys Systolic BP Percentile      Boys Diastolic BP Percentile      Pulse      Resp      Temp      Temp src      SpO2      Weight      Height      Head Circumference      Peak Flow      Pain Score      Pain Loc      Pain Education      Exclude from Growth Chart     Most recent vital signs: Vitals:   12/24/23 0843 12/24/23 0844  BP:  (!) 137/94  Pulse:  97  Resp:  16  Temp:  97.8 F (36.6 C)  SpO2: 98%  100%     General: Awake, no distress.  CV:  Good peripheral perfusion.  Resp:  Normal effort.  No tachypnea or respiratory distress Abd:  No distention.  Nontender Other:  Pupils are equal and reactive, extraocular movements are intact, no cranial nerve deficits, no focal weakness or numbness, patient has no internal lip lacerations, she does have bruising to her internal lower lip, no nuchal rigidity.   ED Results / Procedures / Treatments   Labs (all labs ordered are listed, but only abnormal results are displayed) Labs Reviewed  COMPREHENSIVE METABOLIC PANEL WITH GFR - Abnormal; Notable for the following components:      Result Value   CO2 21 (*)    Glucose, Bld 123 (*)    BUN 22 (*)    Creatinine, Ser 1.24 (*)    GFR, Estimated 55 (*)    All other components within normal limits  URINE DRUG SCREEN, QUALITATIVE ([facility] ONLY) - Abnormal; Notable for  the following components:   Cocaine Metabolite,Ur [place] POSITIVE (*)    All other components within normal limits  URINALYSIS, W/ REFLEX TO CULTURE (INFECTION SUSPECTED) - Abnormal; Notable for the following components:   Color, Urine STRAW (*)    APPearance CLEAR (*)    Specific Gravity, Urine 1.003 (*)    Hgb urine dipstick SMALL (*)    All other components within normal limits  CBC WITH DIFFERENTIAL/PLATELET  ETHANOL  HCG, QUANTITATIVE, PREGNANCY  TROPONIN I (HIGH SENSITIVITY)  TROPONIN I (HIGH SENSITIVITY)     EKG  EKG shows, sinus rhythm, rate 96, normal QRS, normal QTc, T wave inversion to aVL, no obvious ischemic ST elevation, not significantly changed compared to prior   RADIOLOGY On my independent interpretation, CT head without obvious intracranial hemorrhage   PROCEDURES:  Critical Care performed: No  Procedures   MEDICATIONS ORDERED IN ED: Medications  acetaminophen  (TYLENOL ) tablet 1,000 mg (has no administration in time range)  metoCLOPramide  (REGLAN ) injection 10 mg (10 mg Intravenous Given 12/24/23  0854)  lactated ringers  bolus 1,000 mL (1,000 mLs Intravenous New Bag/Given 12/24/23 0858)     IMPRESSION / MDM / ASSESSMENT AND PLAN / ED COURSE  I reviewed the triage vital signs and the nursing notes.                              Differential diagnosis includes, but is not limited to, breakthrough seizure, substance use, electrolyte derangements, no focal deficits at this time to suggest CVA, for headache, consider tension headache, migraine, headache after seizure, patient is not altered, not febrile, no nuchal rigidity to suggest meningitis, no trauma or falls to suggest intracranial hemorrhage.  Will get labs, EKG, troponin, chest x-ray, CT head.  Will give her some fluids, Reglan .  Reassess.  Will put in a number to call for neurology follow-up.  Patient's presentation is most consistent with acute presentation with potential threat to life or bodily function.  Independent interpretation of labs and imaging below.  Patient was monitored in emergency department without recurrent seizures.  Considered but no indication for inpatient admission at this time, she is safe for outpatient management.  Will give her number to call for neurology that she can follow-up outpatient.  Shared decision making to patient and she is agreeable to this plan.  Will discharge with strict return precautions.  The patient is on the cardiac monitor to evaluate for evidence of arrhythmia and/or significant heart rate changes.   Clinical Course as of 12/24/23 1233  Mon Dec 24, 2023  9062 DG Chest 1 View No active disease.  [TT]  [id number] Independent review of labs, UA is not consistent with UTI, hCG is negative, ethanol level is negative, no leukocytosis, creatinine is mildly elevated but this is consistent compared to prior, rest electrolytes not severely deranged, LFTs are normal, initial troponin is negative. [TT]  1023 Cocaine Metabolite,Ur [place](!): POSITIVE [TT]  1115 CT Head Wo Contrast IMPRESSION: Stable and  normal noncontrast CT appearance of the brain. Mild retained secretions in the pharynx.   [TT]  1217 Troponin I (High Sensitivity) Troponin x 2 is negative. [TT]    Clinical Course User Index [TT] Waymond Lorelle Cummins, MD     FINAL CLINICAL IMPRESSION(S) / ED DIAGNOSES   Final diagnoses:  Seizure (HCC)  Cocaine use  Chest tightness  Nonintractable headache, unspecified chronicity pattern, unspecified headache type     Rx / DC Orders  ED Discharge Orders     None        Note:  This document was prepared using Dragon voice recognition software and may include unintentional dictation errors.    Waymond Lorelle Cummins, MD 12/24/23 [phone]

## 2023-12-25 ENCOUNTER — Ambulatory Visit

## 2024-01-01 ENCOUNTER — Ambulatory Visit

## 2024-01-01 VITALS — BP 99/67 | HR 88 | Ht 65.0 in | Wt 144.6 lb

## 2024-01-01 DIAGNOSIS — Z30013 Encounter for initial prescription of injectable contraceptive: Secondary | ICD-10-CM | POA: Diagnosis not present

## 2024-01-01 DIAGNOSIS — Z113 Encounter for screening for infections with a predominantly sexual mode of transmission: Secondary | ICD-10-CM

## 2024-01-01 DIAGNOSIS — Z3009 Encounter for other general counseling and advice on contraception: Secondary | ICD-10-CM

## 2024-01-01 DIAGNOSIS — Z3202 Encounter for pregnancy test, result negative: Secondary | ICD-10-CM | POA: Diagnosis not present

## 2024-01-01 DIAGNOSIS — N9089 Other specified noninflammatory disorders of vulva and perineum: Secondary | ICD-10-CM

## 2024-01-01 DIAGNOSIS — Z3042 Encounter for surveillance of injectable contraceptive: Secondary | ICD-10-CM

## 2024-01-01 LAB — WET PREP FOR TRICH, YEAST, CLUE
Clue Cell Exam: NEGATIVE
Trichomonas Exam: NEGATIVE
Yeast Exam: NEGATIVE

## 2024-01-01 LAB — PREGNANCY, URINE: Preg Test, Ur: NEGATIVE

## 2024-01-01 MED ORDER — MEDROXYPROGESTERONE ACETATE 150 MG/ML IM SUSP
150.0000 mg | INTRAMUSCULAR | Status: AC
  Administered 2024-01-01 – 2024-04-16 (×2): 150 mg via INTRAMUSCULAR

## 2024-01-01 NOTE — Progress Notes (Addendum)
 Pt is here FP visit. Wet Prep results reviewed with pt and requires no treatment. Depo IM injection given to pt at the Lt Deltoid by K. Herring, [employer] Nursing student and supervised by K. Johara Lodwick, RN. Pt tolerated well to the injection with no complications. Condoms declined and reminder card given. Opportunity given to Patient to ask questions for any clarifications, questions answered. Wilkie Drought, RN.

## 2024-01-01 NOTE — Progress Notes (Addendum)
 [employer] HE[facility] DEPARTMENT [facility] [address], [address] [place] [address] Main phone: [phone]  Family Planning Visit - Repeat Yearly Visit  Subjective:  Karen Crane is a 43 y.o. G1P0010  being seen today for an annual wellness visit and to discuss contraception options. The patient is currently using hormonal injection for pregnancy prevention. Patient does not want a pregnancy in the next year.   Patient reports they are looking for a method with the following characteristics:  Method that does not involve too much memory  Patient has the following medical problems:  Patient Active Problem List   Diagnosis Date Noted   Smoker 1/2-1 ppd 11/11/2020   Cocaine abuse (HCC) last use 06/2020 11/11/2020   Substance induced mood disorder (HCC) 06/18/2020   Alcohol  abuse daily with DWI 06/2020 06/18/2020   Chief Complaint  Patient presents with   Contraception    Pt is here for Depo and STD screening    HPI Patient reports desire for Depo provera . Last shot was November, 2022. She doesn't have periods. Feels she has BV symptoms - discharge, odor. Gets BV frequently.   Review of Systems  All other systems reviewed and are negative.  See flowsheet for further details and programmatic requirements Hyperlink available at the top of the signed note in blue.  Flow sheet content below:  Pregnancy Intention Screening Does the patient want to become pregnant in the next year?: No Does the patient's partner want to become pregnant in the next year?: No Would the patient like to discuss contraceptive options today?: No Other:  Password: [id number]  Diabetes screening This patient is 43 y.o. with a BMI of Body mass index is 24.06 kg/m.Karen Crane  Is patient eligible for diabetes screening (age >35 and BMI >25)?  no  Was Hgb A1c ordered? no  STI screening Declines  Hepatitis C screening Has patient been screened once for HCV in the past?  Yes  No  results found for: HCVAB  Does the patient meet criteria for HCV testing? Yes  (If yes-- Screen for HCV through [facility]) Criteria:  Since the last HCV result, does the patient have any of the following? - Current drug use - Have a partner with drug use - Has been incarcerated  Hepatitis B screening Does the patient meet criteria for HBV testing? Yes Criteria:  -Household, sexual or needle sharing contact with HBV -History of drug use -HIV positive -Those with known Hep C  Cervical Cancer Screening  Result Date Procedure Results Follow-ups  11/11/2020 IGP, Aptima HPV DIAGNOSIS:: Comment Specimen adequacy:: Comment Clinician Provided ICD10: Comment Performed by:: Comment PAP Smear Comment: . Note:: Comment Test Methodology: Comment HPV Aptima: Negative     Health Maintenance Due  Topic Date Due   Hepatitis B Vaccines 19-59 Average Risk (3 of 3 - 3-dose series) 11/08/1995   DTaP/Tdap/Td (1 - Tdap) Never done   Pneumococcal Vaccine (1 of 2 - PCV) Never done   HPV VACCINES (1 - 3-dose SCDM series) Never done   INFLUENZA VACCINE  11/30/2023   COVID-19 Vaccine (1 - 2024-25 season) Never done   The following portions of the patient's history were reviewed and updated as appropriate: allergies, current medications, past family history, past medical history, past social history, past surgical history and problem list. Problem list updated.  Objective:   Vitals:   01/01/24 1411  BP: 99/67  Pulse: 88  Weight: 144 lb 9.6 oz (65.6 kg)  Height: 5' 5 (  1.651 m)   Physical Exam Vitals and nursing note reviewed. Exam conducted with a chaperone present [employer] nursing student).  Constitutional:      Appearance: Normal appearance.  HENT:     Head: Normocephalic and atraumatic.     Mouth/Throat:     Mouth: Mucous membranes are moist.     Pharynx: Oropharynx is clear. No oropharyngeal exudate or posterior oropharyngeal erythema.  Pulmonary:     Effort: Pulmonary  effort is normal.  Abdominal:     General: Abdomen is flat.  Genitourinary:    General: Normal vulva.     Exam position: Lithotomy position.     Pubic Area: No rash or pubic lice.      Labia:        Right: No rash or lesion.        Left: No rash or lesion.      Vagina: Normal. No vaginal discharge.      Comments: pH = <4.5 Red line represents a fissure (~1 cm) with a white base. No drainage. Black lines adjacent represent hyperpigmented markings. Swabbed without speculum per patient request, very dry, pale, and atrophic vaginal tissues. Very scant discharge from swab. Lymphadenopathy:     Head:     Right side of head: No preauricular or posterior auricular adenopathy.     Left side of head: No preauricular or posterior auricular adenopathy.     Cervical: No cervical adenopathy.     Upper Body:     Right upper body: No supraclavicular, axillary or epitrochlear adenopathy.     Left upper body: No supraclavicular, axillary or epitrochlear adenopathy.     Lower Body: No right inguinal adenopathy. No left inguinal adenopathy.  Skin:    General: Skin is warm and dry.     Findings: No rash.  Neurological:     Mental Status: She is alert and oriented to person, place, and time.    Assessment and Plan:  Karen Crane is a 43 y.o. female G1P0010 presenting to the [facility] Department for an yearly wellness and contraception visit  Family planning Contraception counseling:  Reviewed options based on patient desire and reproductive life plan. Patient is interested in Hormonal Injection. This was provided to the patient today. Risks, benefits, and typical effectiveness rates were reviewed.  Questions were answered.  Written information was also given to the patient to review.    The patient will follow up in  3 months for surveillance.  The patient was told to call with any further questions, or with any concerns about this method of contraception.  Emphasized use of  condoms 100% of the time for STI prevention.  Emergency Contraception Precautions (ECP): Patient assessed for need of ECP. She is not a candidate based on no intercourse for months.   Declines mammogram referral today. Does not want breast cancer screening. Gave BCCCP pamphlet if she changes her mind. No longer insured.  Due for pap in 2027.  2. Screening for venereal disease (Primary)  - WET PREP FOR TRICH, YEAST, CLUE - Chlamydia/Gonorrhea  Lab  3. Encounter for Depo-Provera  contraception  - No periods, has not been sexually active for a few months - Pregnancy, urine  4. Vulvar fissure - Fissure at base of introitus, not painful. Hyperpigmented area next to area of broken skin. Vulvar/vaginal tissues very dry and atrophic.  - No sexual activity for months per patient - Patient has a primary ob/gyn, she plans to go see her provider for  this. - Declines HSV swab of the lesion today. Less likely HSV due to not painful, not vesicular.  - Advised application of Aquaphor to promote wound healing and use of lubricant for sexual intercourse  Return if symptoms worsen or fail to improve. Follow up with primary care ob/gyn regarding vulvar fissure.  No future appointments.  Damien FORBES Satchel, NP

## 2024-03-01 ENCOUNTER — Other Ambulatory Visit: Payer: Self-pay

## 2024-03-01 ENCOUNTER — Emergency Department: Payer: Self-pay

## 2024-03-01 ENCOUNTER — Emergency Department
Admission: EM | Admit: 2024-03-01 | Discharge: 2024-03-01 | Disposition: A | Discharge status: Home / Self Care | Payer: Self-pay | Attending: Emergency Medicine | Admitting: Emergency Medicine

## 2024-03-01 DIAGNOSIS — M545 Low back pain, unspecified: Secondary | ICD-10-CM | POA: Insufficient documentation

## 2024-03-01 DIAGNOSIS — R0789 Other chest pain: Secondary | ICD-10-CM | POA: Insufficient documentation

## 2024-03-01 DIAGNOSIS — R52 Pain, unspecified: Secondary | ICD-10-CM

## 2024-03-01 DIAGNOSIS — G8929 Other chronic pain: Secondary | ICD-10-CM | POA: Insufficient documentation

## 2024-03-01 DIAGNOSIS — R079 Chest pain, unspecified: Secondary | ICD-10-CM

## 2024-03-01 DIAGNOSIS — F1490 Cocaine use, unspecified, uncomplicated: Secondary | ICD-10-CM | POA: Insufficient documentation

## 2024-03-01 LAB — URINALYSIS, W/ REFLEX TO CULTURE (INFECTION SUSPECTED)
Bilirubin Urine: NEGATIVE
Glucose, UA: NEGATIVE mg/dL
Ketones, ur: 5 mg/dL — AB
Nitrite: NEGATIVE
Protein, ur: 100 mg/dL — AB
RBC / HPF: >50 RBC/hpf (ref 0–5)
Specific Gravity, Urine: 1.018 (ref 1.005–1.030)
Squamous Epithelial / HPF: >50 /HPF (ref 0–5)
WBC, UA: >50 WBC/hpf (ref 0–5)
pH: 5 (ref 5.0–8.0)

## 2024-03-01 LAB — HEPATIC FUNCTION PANEL
ALT: 22 U/L (ref 0–44)
AST: 36 U/L (ref 15–41)
Albumin: 4.3 g/dL (ref 3.5–5.0)
Alkaline Phosphatase: 46 U/L (ref 38–126)
Bilirubin, Direct: 0.3 mg/dL — ABNORMAL HIGH (ref 0.0–0.2)
Indirect Bilirubin: 2.6 mg/dL — ABNORMAL HIGH (ref 0.3–0.9)
Total Bilirubin: 2.9 mg/dL — ABNORMAL HIGH (ref 0.0–1.2)
Total Protein: 7.7 g/dL (ref 6.5–8.1)

## 2024-03-01 LAB — BASIC METABOLIC PANEL WITH GFR
Anion gap: 15 (ref 5–15)
BUN: 25 mg/dL — ABNORMAL HIGH (ref 6–20)
CO2: 20 mmol/L — ABNORMAL LOW (ref 22–32)
Calcium: 9.5 mg/dL (ref 8.9–10.3)
Chloride: 104 mmol/L (ref 98–111)
Creatinine, Ser: 1.55 mg/dL — ABNORMAL HIGH (ref 0.44–1.00)
GFR, Estimated: 42 mL/min — ABNORMAL LOW (ref >60)
Glucose, Bld: 142 mg/dL — ABNORMAL HIGH (ref 70–99)
Potassium: 4.1 mmol/L (ref 3.5–5.1)
Sodium: 139 mmol/L (ref 135–145)

## 2024-03-01 LAB — CBC
HCT: 44.9 % (ref 36.0–46.0)
Hemoglobin: 14.4 g/dL (ref 12.0–15.0)
MCH: 31.6 pg (ref 26.0–34.0)
MCHC: 32.1 g/dL (ref 30.0–36.0)
MCV: 98.5 fL (ref 80.0–100.0)
Platelets: 239 K/uL (ref 150–400)
RBC: 4.56 MIL/uL (ref 3.87–5.11)
RDW: 13 % (ref 11.5–15.5)
WBC: 7.1 K/uL (ref 4.0–10.5)
nRBC: 0 % (ref 0.0–0.2)

## 2024-03-01 LAB — POC URINE PREG, ED: Preg Test, Ur: NEGATIVE

## 2024-03-01 LAB — RESP PANEL BY RT-PCR (RSV, FLU A&B, COVID)  RVPGX2
Influenza A by PCR: NEGATIVE
Influenza B by PCR: NEGATIVE
Resp Syncytial Virus by PCR: NEGATIVE
SARS Coronavirus 2 by RT PCR: NEGATIVE

## 2024-03-01 LAB — URINE DRUG SCREEN, QUALITATIVE (ARMC ONLY)
Amphetamines, Ur Screen: NOT DETECTED
Barbiturates, Ur Screen: NOT DETECTED
Benzodiazepine, Ur Scrn: POSITIVE — AB
Cannabinoid 50 Ng, Ur ~~LOC~~: NOT DETECTED
Cocaine Metabolite,Ur ~~LOC~~: POSITIVE — AB
MDMA (Ecstasy)Ur Screen: NOT DETECTED
Methadone Scn, Ur: NOT DETECTED
Opiate, Ur Screen: NOT DETECTED
Phencyclidine (PCP) Ur S: NOT DETECTED
Tricyclic, Ur Screen: NOT DETECTED

## 2024-03-01 LAB — GROUP A STREP BY PCR: Group A Strep by PCR: NOT DETECTED

## 2024-03-01 LAB — CK: Total CK: 266 U/L — ABNORMAL HIGH (ref 38–234)

## 2024-03-01 LAB — TROPONIN I (HIGH SENSITIVITY): Troponin I (High Sensitivity): 9 ng/L (ref <18)

## 2024-03-01 MED ORDER — ACETAMINOPHEN 500 MG PO TABS
1000.0000 mg | ORAL_TABLET | Freq: Once | ORAL | Status: AC
  Administered 2024-03-01: 1000 mg via ORAL
  Filled 2024-03-01: qty 2

## 2024-03-01 NOTE — ED Notes (Addendum)
 Pt stating she is wanting to go home. Her urine sample will be the same as provided earlier. This RN attempted to instruct pt on providing new sample. Pt stating I want to go home. I am in so much pain. Pt requesting phone to call home. Informed MD Quale. MD stated he will speak with pt.

## 2024-03-01 NOTE — ED Provider Notes (Addendum)
 [facility] Provider Note    Event Date/Time   First MD Initiated Contact with Patient 03/01/24 1300     (approximate)   History   Chest Pain, Cough, and Sore Throat   HPI  Karen Crane is a 43 y.o. female past medical history significant for substance use disorder, seizures, presents to the emergency department with multiple complaints.  Patient states that she has been having chest pain.  Chest pain has been present for multiple months but has worsened over the past week.  Denies any significant shortness of breath.  Does endorse cough, sore throat and feels like her entire body hurts.  States that she last used cocaine a couple of days ago.  States that her chest was hurting prior to the cocaine use so she knows that it is not the cocaine that is causing her chest pain.  No prior history of ACS.  Also complaining of some abdominal pain.  No nausea or vomiting.  Complaining of low back pain which she states has been ongoing.  Adamantly denies any IV drug use and states that she only uses cocaine.  Denies dysuria, urinary urgency or frequency.  Denies any urinary or bowel incontinence.     Physical Exam   Triage Vital Signs: ED Triage Vitals  Encounter Vitals Group     BP 03/01/24 1216 115/76     Girls Systolic BP Percentile --      Girls Diastolic BP Percentile --      Boys Systolic BP Percentile --      Boys Diastolic BP Percentile --      Pulse Rate 03/01/24 1216 88     Resp 03/01/24 1216 20     Temp 03/01/24 1216 97.8 F (36.6 C)     Temp Source 03/01/24 1216 Oral     SpO2 03/01/24 1216 97 %     Weight 03/01/24 1213 145 lb (65.8 kg)     Height 03/01/24 1213 5' 5 (1.651 m)     Head Circumference --      Peak Flow --      Pain Score 03/01/24 1212 7     Pain Loc --      Pain Education --      Exclude from Growth Chart --     Most recent vital signs: Vitals:   03/01/24 1216  BP: 115/76  Pulse: 88  Resp: 20  Temp: 97.8 F (36.6 C)   SpO2: 97%    Physical Exam Constitutional:      Appearance: She is well-developed.  HENT:     Head: Atraumatic.  Eyes:     Conjunctiva/sclera: Conjunctivae normal.  Cardiovascular:     Rate and Rhythm: Regular rhythm.     Pulses:          Dorsalis pedis pulses are 2+ on the right side and 2+ on the left side.       Posterior tibial pulses are 2+ on the right side and 2+ on the left side.     Heart sounds: Normal heart sounds. No murmur heard. Pulmonary:     Effort: Pulmonary effort is normal. No respiratory distress.  Abdominal:     General: There is no distension.     Tenderness: There is abdominal tenderness (ruq ttp, neg murphy sign).  Musculoskeletal:        General: Normal range of motion.     Cervical back: Normal range of motion.     Right lower leg:  No edema.     Left lower leg: No edema.  Skin:    General: Skin is warm.     Capillary Refill: Capillary refill takes less than 2 seconds.  Neurological:     Mental Status: She is alert. Mental status is at baseline.     IMPRESSION / MDM / ASSESSMENT AND PLAN / ED COURSE  I reviewed the triage vital signs and the nursing notes.  Differential diagnosis including cocaine chest pain, anemia, pneumothorax, pneumonia, ACS, cholelithiasis, viral illness including COVID/influenza, strep pharyngitis.  No signs of PTA or RPA.  EKG  I, Clotilda Punter, the attending physician, personally viewed and interpreted this ECG.  EKG shows findings consistent with atrial enlargement.  Wandering baseline.  No significant ST elevation or depression.  No findings of acute ischemia or dysrhythmia.  Narrow complex.  QTc 463.  No tachycardic or bradycardic dysrhythmias while on cardiac telemetry.  RADIOLOGY I independently reviewed imaging, my interpretation of imaging: Right upper quadrant ultrasound -no obvious gallstones or findings of acute cholecystitis.  Read is pending.  Right upper quadrant ultrasound with no acute  findings  Chest x-ray chest x-ray no signs of pneumonia no acute findings.  LABS (all labs ordered are listed, but only abnormal results are displayed) Labs interpreted as -    Labs Reviewed  BASIC METABOLIC PANEL WITH GFR - Abnormal; Notable for the following components:      Result Value   CO2 20 (*)    Glucose, Bld 142 (*)    BUN 25 (*)    Creatinine, Ser 1.55 (*)    GFR, Estimated 42 (*)    All other components within normal limits  URINE DRUG SCREEN, QUALITATIVE ([facility] ONLY) - Abnormal; Notable for the following components:   Cocaine Metabolite,Ur [place] POSITIVE (*)    Benzodiazepine, Ur [place] POSITIVE (*)    All other components within normal limits  HEPATIC FUNCTION PANEL - Abnormal; Notable for the following components:   Total Bilirubin 2.9 (*)    Bilirubin, Direct 0.3 (*)    Indirect Bilirubin 2.6 (*)    All other components within normal limits  CK - Abnormal; Notable for the following components:   Total CK 266 (*)    All other components within normal limits  RESP PANEL BY RT-PCR (RSV, FLU A&B, COVID)  [id number]  GROUP A STREP BY PCR  CBC  URINALYSIS, W/ REFLEX TO CULTURE (INFECTION SUSPECTED)  POC URINE PREG, ED  TROPONIN I (HIGH SENSITIVITY)     MDM  Given Tylenol  for pain control.  Concerned that she could have chest pain secondary to cocaine use.  Troponin is negative no EKG findings.  I have a low suspicion for pulmonary embolism given no significant shortness of breath, otherwise PERC negative.  Complaining of back pain but does not have any signs or symptoms concerning for cauda equina or epidural compression syndrome, adamantly denies any IV drug use.  Patient able to ambulate in the emergency department without any difficulties.  No incontinence, no saddle anesthesia and no lower extremity or upper extremity weakness.  Has diffuse body pain and is very tender no matter where I touch her and appears to be hyperalgesic.  Does not meet criteria for an emergent  MRI and have a very low suspicion for cauda equina or epidural compression syndrome.  No fever no chills no leukocytosis.  Does not have any IV drug use.  Discussed return precautions with the patient.  Given a referral for primary care physician.  Encouraged to reestablish with her spine team at [facility].  Discussed that she may need an outpatient MRI.  Discussed return for any ongoing or worsening symptoms.  Plan to discharge if right upper quadrant ultrasound is negative for acute cholecystitis.  Have a very low suspicion for pulmonary embolism, low risk Wells criteria and PERC negative.  No concern for dissection.  Mildly elevated CK but no findings that are consistent with rhabdomyolysis.  UDS positive for benzodiazepine and cocaine.  Given referral from primary care.  Discussed close follow-up.  Discussed return precautions.  No questions or concerns at time of discharge.     PROCEDURES:  Critical Care performed: No  Procedures  Patient's presentation is most consistent with acute presentation with potential threat to life or bodily function.   MEDICATIONS ORDERED IN ED: Medications  acetaminophen  (TYLENOL ) tablet 1,000 mg (1,000 mg Oral Given 03/01/24 1459)    FINAL CLINICAL IMPRESSION(S) / ED DIAGNOSES   Final diagnoses:  Body aches  Chest pain, unspecified type  Chronic low back pain without sciatica, unspecified back pain laterality  Cocaine use     Rx / DC Orders   ED Discharge Orders          Ordered    Ambulatory Referral to Primary Care (Establish Care)        03/01/24 1516             Note:  This document was prepared using Dragon voice recognition software and may include unintentional dictation errors.   Suzanne Kirsch, MD 03/01/24 8481    Suzanne Kirsch, MD 03/01/24 1551

## 2024-03-01 NOTE — ED Notes (Signed)
 Called lab at this time. Running add ons.

## 2024-03-01 NOTE — ED Triage Notes (Signed)
 Pt to ED for chest pain on and off for awhile, body aches since today, cough since 9/1, and sore throat since this AM. Also she's having breakthrough bleeding even though she's on depo. Current smoker. Pt in NAD, skin dry.

## 2024-03-01 NOTE — ED Provider Notes (Signed)
 Vitals:   03/01/24 1430 03/01/24 1445  BP:    Pulse: 72 72  Resp:    Temp:    SpO2: 100% 100%    Was awaiting repeat urinalysis as it was quite contaminated.  Patient reports that she does not wish to stay for further medical treatment.  She wants to leave, she is awake alert well-oriented.  Encouraged her to give us  a second urine sample that we could check for infection.  She however does not have any obvious symptoms of urinary infection reported, but she advises that she does not wish to stay any longer.  She declines to give a second urine sample even though I told her it be helpful to rule out infection.  Though she does not have a fever no elevated white count and based on symptoms it seems unlikely that she would have an acute urinary infection at this point.  Patient certainly has the capacity and understand to make her own medical decisions, she is notified me that she does not wish to stay any further.  Based on the workup performed by the previous provider I would agree it does not appear that there is an emergent medical condition noted at this time.  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  Patient agreeable to follow-up as able outpatient.  Return precautions provided   Dicky Anes, MD 03/01/24 [facility]

## 2024-03-01 NOTE — Discharge Instructions (Signed)
 You were seen in the emergency department for multiple complaints of chest pain, shortness of breath, cough, sore throat and bodyaches and back pain.  Your lab work was overall negative and do not have any concern that you are having a heart attack today.  Concern that your chest pain could be coming from cocaine use.  Encouraged to discontinue cocaine use.  Your chest x-ray did not show any signs of pneumonia.  Your COVID and flu test were negative.  The ultrasound of your gallbladder did not look infected.  It is importantly follow-up closely as an outpatient with primary care, you were given a referral for follow-up.  Return to the emergency department if you have any worsening symptoms, weakness or urinary or bowel incontinence.

## 2024-04-10 ENCOUNTER — Ambulatory Visit: Payer: Self-pay

## 2024-04-16 ENCOUNTER — Ambulatory Visit: Payer: Self-pay

## 2024-04-16 VITALS — BP 123/67 | HR 78 | Ht 65.0 in | Wt 145.2 lb

## 2024-04-16 DIAGNOSIS — N898 Other specified noninflammatory disorders of vagina: Secondary | ICD-10-CM | POA: Diagnosis not present

## 2024-04-16 DIAGNOSIS — Z30013 Encounter for initial prescription of injectable contraceptive: Secondary | ICD-10-CM | POA: Diagnosis not present

## 2024-04-16 DIAGNOSIS — Z3009 Encounter for other general counseling and advice on contraception: Secondary | ICD-10-CM | POA: Diagnosis not present

## 2024-04-16 LAB — WET PREP FOR TRICH, YEAST, CLUE
Clue Cell Exam: NEGATIVE
Trichomonas Exam: NEGATIVE
Yeast Exam: NEGATIVE

## 2024-04-16 NOTE — Progress Notes (Signed)
 Pt is here PE/depo. Wet Prep results reviewed with pt and requires no treatment. Depo IM injection given to pt at the Lt Deltoid and pt tolerated well to the injection with no complications. Condoms declined and reminder card given. Opportunity given to Patient to ask questions for any clarifications, questions answered. Wilkie Drought, RN.

## 2024-04-16 NOTE — Progress Notes (Signed)
 [facility] [address], [address] [place] [address] Main phone: [phone]  Women's Health Problem Visit   Subjective:  Karen Crane is a 43 y.o. being seen today for vulvar itching/burning.   Chief Complaint  Patient presents with   Annual Exam    PE/Depo   HPI: Karen Crane reports continued/ongoing vulvar itching and burning. No sex since she was last seen on 01/01/24 for a physical.  At that visit, she had a vulvar fissure and we discussed vulvar care. Negative wet prep at that visit.   Health Maintenance Due  Topic Date Due   Hepatitis B Vaccines 19-59 Average Risk (3 of 3 - 3-dose series) 11/08/1995   DTaP/Tdap/Td (1 - Tdap) Never done   Pneumococcal Vaccine (1 of 2 - PCV) Never done   HPV VACCINES (1 - 3-dose SCDM series) Never done   Mammogram  Never done   Influenza Vaccine  11/30/2023   COVID-19 Vaccine (1 - 2025-26 season) Never done   Colonoscopy  Never done   The following portions of the patient's history were reviewed and updated as appropriate: allergies, current medications, past family history, past medical history, past social history, past surgical history and problem list. Problem list updated.  See flowsheet for other program required questions.  Objective:   Vitals:   04/16/24 1522  BP: 123/67  Pulse: 78  Weight: 145 lb 3.2 oz (65.9 kg)  Height: 5' 5 (1.651 m)   Physical Exam Vitals and nursing note reviewed. Exam conducted with a chaperone present Brett Orange).  Constitutional:      Appearance: Normal appearance.  HENT:     Head: Normocephalic and atraumatic.     Comments: No nits or hair loss of scalp, brows, and lashes    Mouth/Throat:     Mouth: Mucous membranes are moist.     Pharynx: Oropharynx is clear. No oropharyngeal exudate or posterior oropharyngeal erythema.  Eyes:     General:        Right eye: No discharge.        Left eye: No discharge.      Conjunctiva/sclera: Conjunctivae normal.     Right eye: Right conjunctiva is not injected.     Left eye: Left conjunctiva is not injected.  Pulmonary:     Effort: Pulmonary effort is normal.  Abdominal:     Tenderness: There is no abdominal tenderness. There is no rebound.  Genitourinary:    General: Normal vulva.     Exam position: Lithotomy position.     Pubic Area: No rash or pubic lice.      Labia:        Right: No rash or lesion.        Left: No rash or lesion.      Vagina: Normal. No vaginal discharge, erythema or lesions.     Cervix: No discharge, lesion or erythema.     Comments: pH = <4.5 Lymphadenopathy:     Cervical: No cervical adenopathy.     Upper Body:     Right upper body: No supraclavicular adenopathy.     Left upper body: No supraclavicular adenopathy.  Skin:    General: Skin is warm and dry.     Findings: No lesion or rash.  Neurological:     Mental Status: She is alert and oriented to person, place, and time.    Assessment and Plan:  Karen Crane is a 43 y.o. female presenting to the [facility] for a Women's Health problem visit  1. Vaginal itching (Primary)  - WET PREP FOR TRICH, YEAST, CLUE negative today - Chlamydia/Gonorrhea [facility]  - Reiterated vulvar care including vulvar/vaginal moisturizers and lubricant for sex  Return if symptoms worsen or fail to improve.  No future appointments.  Damien FORBES Satchel, NP
